# Patient Record
Sex: Male | Born: 1935 | Race: White | Hispanic: No | Smoking: Former smoker
Health system: Southern US, Community
[De-identification: ages and names within clinical notes are randomized; demographics above are authoritative.]

## PROBLEM LIST (undated history)

## (undated) DIAGNOSIS — R51 Headache: Secondary | ICD-10-CM

## (undated) DIAGNOSIS — M1990 Unspecified osteoarthritis, unspecified site: Secondary | ICD-10-CM

## (undated) DIAGNOSIS — J189 Pneumonia, unspecified organism: Secondary | ICD-10-CM

## (undated) DIAGNOSIS — K219 Gastro-esophageal reflux disease without esophagitis: Secondary | ICD-10-CM

## (undated) DIAGNOSIS — I739 Peripheral vascular disease, unspecified: Secondary | ICD-10-CM

## (undated) DIAGNOSIS — I2510 Atherosclerotic heart disease of native coronary artery without angina pectoris: Secondary | ICD-10-CM

## (undated) HISTORY — PX: CARDIAC CATHETERIZATION: SHX172

---
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
## 1996-10-30 HISTORY — PX: CORONARY ARTERY BYPASS GRAFT: SHX141

## 2000-10-02 ENCOUNTER — Encounter: Admission: RE | Admit: 2000-10-02 | Discharge: 2000-10-02 | Payer: Self-pay

## 2003-01-21 ENCOUNTER — Encounter: Admission: RE | Admit: 2003-01-21 | Discharge: 2003-01-21 | Payer: Self-pay

## 2010-06-28 ENCOUNTER — Inpatient Hospital Stay (HOSPITAL_COMMUNITY): Admission: RE | Admit: 2010-06-28 | Discharge: 2010-07-02 | Payer: Self-pay | Admitting: Orthopedic Surgery

## 2010-09-16 HISTORY — PX: JOINT REPLACEMENT: SHX530

## 2010-11-28 LAB — BASIC METABOLIC PANEL
GFR calc Af Amer: >60 mL/min (ref >60)
GFR calc non Af Amer: 55 mL/min — ABNORMAL LOW (ref >60)
Glucose, Bld: 118 mg/dL — ABNORMAL HIGH (ref 70–99)
Potassium: 4.8 mEq/L (ref 3.5–5.1)
Sodium: 133 mEq/L — ABNORMAL LOW (ref 135–145)

## 2010-11-28 LAB — CBC
HCT: 27.3 % — ABNORMAL LOW (ref 39.0–52.0)
HCT: 29.4 % — ABNORMAL LOW (ref 39.0–52.0)
Hemoglobin: 10.2 g/dL — ABNORMAL LOW (ref 13.0–17.0)
Hemoglobin: 9.4 g/dL — ABNORMAL LOW (ref 13.0–17.0)
MCHC: 34.5 g/dL (ref 30.0–36.0)
MCHC: 34.8 g/dL (ref 30.0–36.0)
RBC: 2.92 MIL/uL — ABNORMAL LOW (ref 4.22–5.81)
RBC: 3.16 MIL/uL — ABNORMAL LOW (ref 4.22–5.81)
WBC: 9.8 10*3/uL (ref 4.0–10.5)

## 2010-11-28 LAB — PROTIME-INR
INR: 2.04 — ABNORMAL HIGH (ref 0.00–1.49)
INR: 2.1 — ABNORMAL HIGH (ref 0.00–1.49)
INR: 2.17 — ABNORMAL HIGH (ref 0.00–1.49)
Prothrombin Time: 23.2 seconds — ABNORMAL HIGH (ref 11.6–15.2)

## 2010-11-29 LAB — PROTIME-INR
INR: 1.04 (ref 0.00–1.49)
Prothrombin Time: 13.8 seconds (ref 11.6–15.2)

## 2010-11-29 LAB — URINALYSIS, ROUTINE W REFLEX MICROSCOPIC
Bilirubin Urine: NEGATIVE
Glucose, UA: NEGATIVE mg/dL
Leukocytes, UA: NEGATIVE
Nitrite: NEGATIVE
Protein, ur: NEGATIVE mg/dL
Specific Gravity, Urine: 1.012 (ref 1.005–1.030)
Specific Gravity, Urine: 1.027 (ref 1.005–1.030)
Urobilinogen, UA: 0.2 mg/dL (ref 0.0–1.0)
Urobilinogen, UA: 0.2 mg/dL (ref 0.0–1.0)
pH: 5.5 (ref 5.0–8.0)

## 2010-11-29 LAB — COMPREHENSIVE METABOLIC PANEL
Albumin: 3.9 g/dL (ref 3.5–5.2)
Alkaline Phosphatase: 80 U/L (ref 39–117)
BUN: 14 mg/dL (ref 6–23)
Calcium: 9.5 mg/dL (ref 8.4–10.5)
Glucose, Bld: 80 mg/dL (ref 70–99)
Potassium: 4.3 mEq/L (ref 3.5–5.1)
Sodium: 140 mEq/L (ref 135–145)
Total Protein: 7 g/dL (ref 6.0–8.3)

## 2010-11-29 LAB — BASIC METABOLIC PANEL
CO2: 28 mEq/L (ref 19–32)
Chloride: 101 mEq/L (ref 96–112)
Creatinine, Ser: 1.18 mg/dL (ref 0.4–1.5)
GFR calc Af Amer: >60 mL/min (ref >60)
Glucose, Bld: 128 mg/dL — ABNORMAL HIGH (ref 70–99)

## 2010-11-29 LAB — CBC
MCH: 32 pg (ref 26.0–34.0)
MCHC: 34.5 g/dL (ref 30.0–36.0)
MCHC: 34.6 g/dL (ref 30.0–36.0)
MCV: 92.5 fL (ref 78.0–100.0)
MCV: 92.8 fL (ref 78.0–100.0)
Platelets: 183 10*3/uL (ref 150–400)
Platelets: 213 10*3/uL (ref 150–400)
RBC: 3.18 MIL/uL — ABNORMAL LOW (ref 4.22–5.81)
RDW: 12.8 % (ref 11.5–15.5)
WBC: 7 10*3/uL (ref 4.0–10.5)

## 2010-11-29 LAB — URINE MICROSCOPIC-ADD ON

## 2010-11-29 LAB — TYPE AND SCREEN
ABO/RH(D): O POS
Antibody Screen: NEGATIVE

## 2011-09-01 IMAGING — CR DG HIP 1V PORT*R*
1 series · 1 of 1 positions shown · non-contrast
Comparison: 06/21/2010.

CLINICAL DATA: 77-year-old male status post right total hip.

PORTABLE RIGHT HIP - 1 VIEW

[series 1]
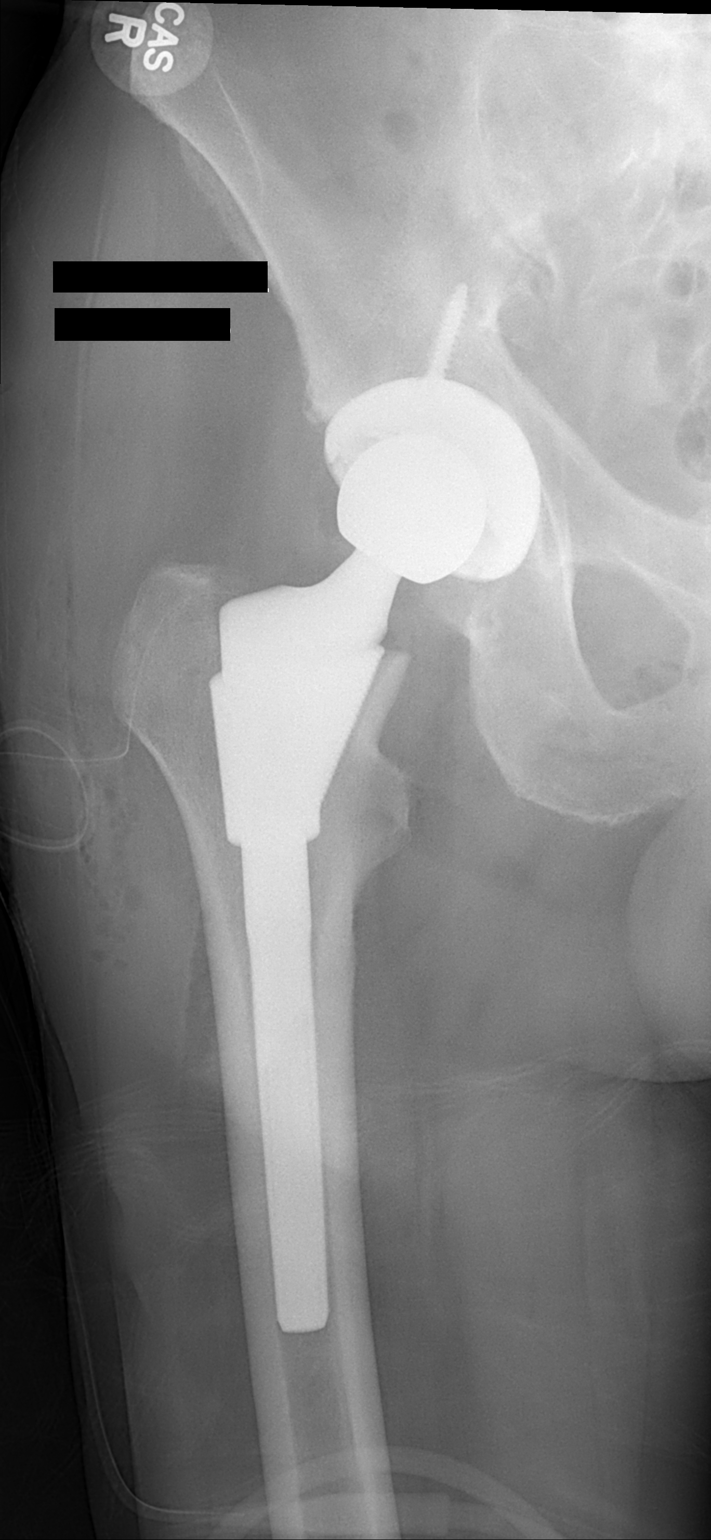

[1 of 1 positions shown; findings below may reference images not displayed]

FINDINGS: Portable AP view at 8240 hours.  Right total hip
arthroplasty components now in place and appear normally aligned on
the AP view.  Small postoperative drain in place.  No adverse
hardware or bony features.
IMPRESSION: Right total hip arthroplasty with normal alignment on the AP view
and no adverse features.

## 2011-09-01 IMAGING — CR DG PORTABLE PELVIS
1 series · 1 of 1 positions shown · non-contrast
Comparison: 06/21/2010.

CLINICAL DATA: 77-year-old male status post right total hip.

PORTABLE PELVIS

[series 1]
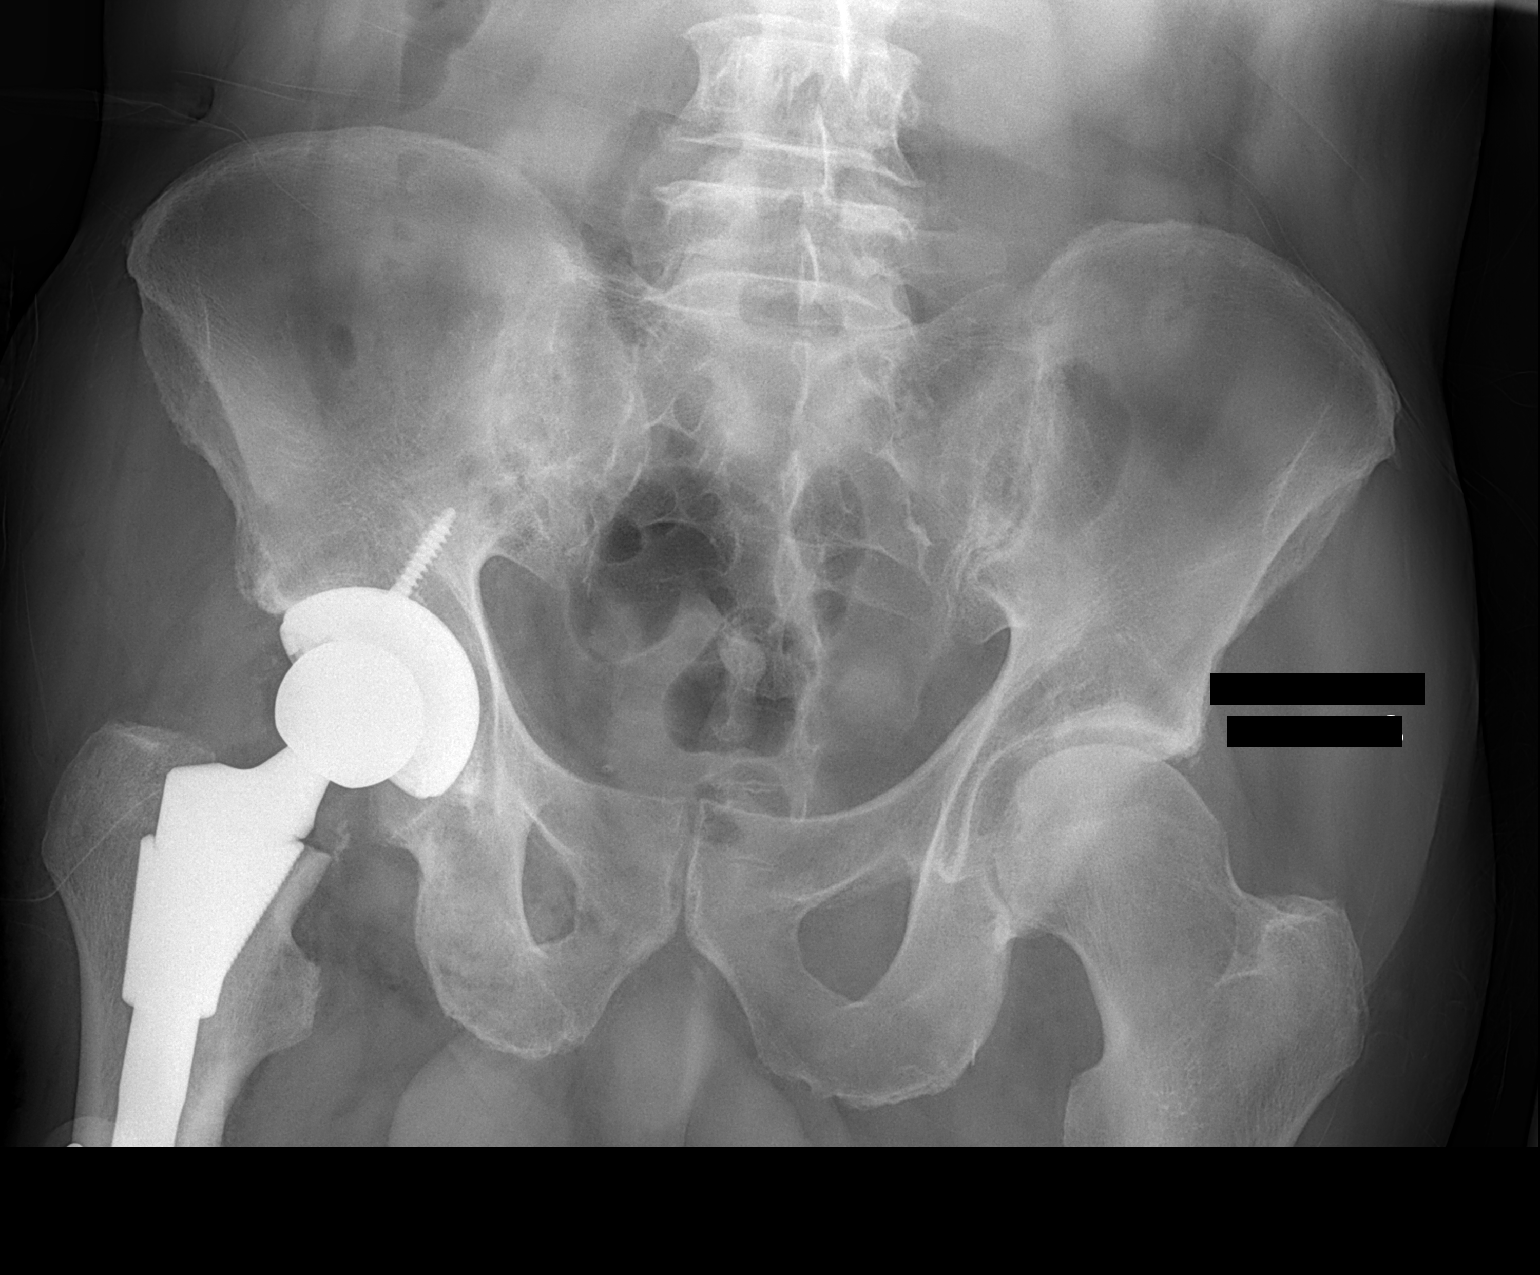

[1 of 1 positions shown; findings below may reference images not displayed]

FINDINGS: Portable AP view the pelvis at 0241 hours.  Right total
hip arthroplasty.  Femoral component not entirely included.  Normal
alignment.  Percutaneous small postoperative drain in place.
Stable surgical clips about the left inguinal region.  Stable
osseous structures otherwise.
IMPRESSION: Right total hip arthroplasty.  No adverse features.

## 2013-07-06 ENCOUNTER — Other Ambulatory Visit: Payer: Self-pay | Admitting: Orthopedic Surgery

## 2013-07-06 NOTE — H&P (Signed)
Mario Edwards  DOB: 10/10/1931 Married / Language: English / Race: White Male  Date of Admission:  07/19/2013  Chief Complaint:  Left Knee Pain  History of Present Illness The patient is a 80 year old male who comes in for a preoperative History and Physical. The patient is scheduled for a left total knee arthroplasty to be performed by Dr. Frank V. Aluisio, MD at [facility] on 07/19/2013. The patient is a 80 year old male who presents today for follow up of their knee. The patient is being followed for their bilateral knee pain and osteoarthritis (last evaluation 10/11/10). Symptoms reported today include: pain (that is getting progressively worse over time, left knee is worse) and instability (currently using a cane), while the patient does not report symptoms of: swelling. The following medication has been used for pain control: none. Mario Edwards comes in today for evaluation of both of his knees. He was last seen back in Jan. 2012 for follow up on his gel injection series. He states today that the shots really did not help his knees. He has been putting up with them for a while but they have progressively gotten worse with time. The knees have now become more of an issue and he has started to rely on his cane more and more. The knees are popping and grinding and now the give him the sensation of buckling on him. There is also a locking sensation with both. He feels more unstable due to the progressive nature of his knees. He complains of sedintary stiffness first thing in the morning and with any episode of sitting or riding in the car. He denies swelling with either knee. He originally had this visit scheduled to come in and discuss knee replacements and had questions about getting them both replaced at the same time. Unfortunately, he wife recently received a new diagnosis.  She has been evaluated and he is now ready to proceed with knee replacement at this time. He does  have a significant history of having coronary arterail disease that required a 4 vessel bypass back on Feb. 14, 1998. He denied having any myocardial infarction or caronary event prior to his surgery. They fortunately caught it and he denies having any cardiac symptoms since then. He sees a cardiologist over in [place], [place]. The knees are getting worse. His left knee hurts more than the right, but both are bothering him at all times. He is ready to get his knee replaced. Injections in the past did not help. He is ready to proceed to surgery at this time.   Problem List Primary osteoarthritis of both knees (715.16)    Allergies No Known Drug Allergies    Family History Cerebrovascular Accident. First Degree Relatives. father Cancer. mother    Social History Exercise. Exercises daily; does other Illicit drug use. no Drug/Alcohol Rehab (Previously). no Current work status. retired Drug/Alcohol Rehab (Currently). no Living situation. live with spouse Tobacco / smoke exposure. yes Pain Contract. no Marital status. married Number of flights of stairs before winded. 1 Tobacco use. Former smoker. former smoker; smoke(d) 3/4 pack(s) per day; uses 1 1/2 can(s) smokeless per week Alcohol use. current drinker; drinks beer, wine and hard liquor; less than 5 per week Children. 1 Advance Directives. Living Will Post-Surgical Plans. [facility]    Medication History Aspirin (325MG Tablet, 1 (one) Oral) Active. Ranitidine HCl ( Oral) Specific dose unknown - Active. Lipitor (40MG Tablet, Oral) Active. Toprol XL (   Oral) Specific dose unknown - Active. Calcium 600 ( Oral) Specific dose unknown - Active.    Past Surgical History Coronary Artery Bypass Graft. 4 Total Hip Replacement. right Appendectomy. Date: 1950. Vasectomy    Medical History Coronary artery disease Aneurysm. Femoral Hypercholesterolemia Carotid Artery  Stenosis. approxiamtely 20% bilateral (as per patient)    Review of Systems General:Not Present- Chills, Fever, Night Sweats, Fatigue, Weight Gain, Weight Loss and Memory Loss. Skin:Not Present- Hives, Itching, Rash, Eczema and Lesions. HEENT:Present- Double Vision. Not Present- Tinnitus, Headache, Visual Loss, Hearing Loss and Dentures. Respiratory:Present- Shortness of breath with exertion. Not Present- Shortness of breath at rest, Allergies, Coughing up blood and Chronic Cough. Cardiovascular:Not Present- Chest Pain, Racing/skipping heartbeats, Difficulty Breathing Lying Down, Murmur, Swelling and Palpitations. Gastrointestinal:Not Present- Bloody Stool, Heartburn, Abdominal Pain, Vomiting, Nausea, Constipation, Diarrhea, Difficulty Swallowing, Jaundice and Loss of appetitie. Male Genitourinary:Present- Weak urinary stream and Urinating at Night. Not Present- Urinary frequency, Blood in Urine, Discharge, Flank Pain, Incontinence, Painful Urination, Urgency and Urinary Retention. Musculoskeletal:Present- Morning Stiffness. Not Present- Muscle Weakness, Muscle Pain, Joint Swelling, Joint Pain, Back Pain and Spasms. Neurological:Not Present- Tremor, Dizziness, Blackout spells, Paralysis, Difficulty with balance and Weakness. Psychiatric:Not Present- Insomnia.    Vitals Weight: 188 lb Height: 72 in Weight was reported by patient. Height was reported by patient. Body Surface Area: 2.08 m Body Mass Index: 25.5 kg/m Pulse: 64 (Regular) Resp.: 14 (Unlabored) BP: 130/70 (Sitting, Right Arm, Standard)     Physical Exam The physical exam findings are as follows:   General Mental Status - Alert, cooperative and good historian. General Appearance- pleasant. Not in acute distress. Orientation- Oriented X3. Build & Nutrition- Well nourished and Well developed.   Head and Neck Head- normocephalic, atraumatic . Neck Global Assessment- supple. no  bruit auscultated on the right and no bruit auscultated on the left.   Eye Vision- Wears corrective lenses. Pupil- Bilateral- Regular and Round. Motion- Bilateral- EOMI.   Chest and Lung Exam Auscultation: Breath sounds:- clear at anterior chest wall and - clear at posterior chest wall. Adventitious sounds:- No Adventitious sounds.   Cardiovascular Auscultation:Rhythm- Regular rate and rhythm. Heart Sounds- S1 WNL and S2 WNL. Murmurs & Other Heart Sounds: Murmur 1:Location- Pulmonic Area (faint). Grade- II/VI. Character- Crescendo/Decrescendo and Low pitched.   Abdomen Palpation/Percussion:Tenderness- Abdomen is non-tender to palpation. Rigidity (guarding)- Abdomen is soft. Auscultation:Auscultation of the abdomen reveals - Bowel sounds normal.   Male Genitourinary Not done, not pertinent to present illness  Musculoskeletal On exam, he is a well-developed male, alert and oriented, in no apparent distress. Both hips show normal ROM and no discomfort. Both knees show no effusion. There is marked crepitus on ROM of both knees. He has varus deformities bilaterally. Range is about 5-125 on each knee. There is no instability noted. Gait pattern is antalgic.  RADIOGRAPHS: AP and lateral of both knees show end stage arthritic changes, bone on bone, medial and patellofemoral compartments of both knees.   Assessment & Plan Primary osteoarthritis of both knees (715.16) Impression: Left Knee  Note: Plan is for a left Total knee Replacement by Dr. Aluisio.  Plan is to go [facility] [place].  PCP - Dr. Cheek - Patient has been seen preoperatively and felt to be stable for surgery.  The patient will not receive TXA (tranexamic acid) due to: Heart Disease, Coronary Arterial Disease, Mild Carotid Arterial Disease  Signed electronically by Correna Meacham L Avant Printy, III PA-C 

## 2013-07-09 ENCOUNTER — Encounter (HOSPITAL_COMMUNITY): Payer: Self-pay | Admitting: Pharmacy Technician

## 2013-07-13 ENCOUNTER — Other Ambulatory Visit (HOSPITAL_COMMUNITY): Payer: Self-pay | Admitting: *Deleted

## 2013-07-13 NOTE — Patient Instructions (Addendum)
20      Your procedure is scheduled on:  Monday 07/19/2013  Report to [facility] at 1205 pM.  Call this number if you have problems the night before or morning of surgery:  [phone]   Remember:             IF YOU USE CPAP,BRING MASK AND TUBING AM OF SURGERY!   Do not eat food AFTER MIDNIGHT!MAY HAVE CLEAR LIQUIDS FROM MIDNIGHT UP UNTIL 0905 AM THE MORNING OF SURGERY AND THEN NOTHING UNTIL AFTER SURGERY!  Take these medicines the morning of surgery with A SIP OF WATER: Metoprolol, Zantac   Do not bring valuables to the hospital. [place] IS NOT RESPONSIBLE  FOR ANY BELONGINGS OR VALUABLES BROUGHT TO HOSPITAL.  Marland Kitchen  Leave suitcase in the car. After surgery it may be brought to your room.  For patients admitted to the hospital, checkout time is 11:00 AM the day of              Discharge.    DO NOT WEAR JEWELRY , MAKE-UP, LOTIONS,POWDERS,PERFUMES!             WOMEN -DO NOT SHAVE LEGS OR UNDERARMS 12 HRS. BEFORE SURGERY!               MEN MAY SHAVE AS USUAL!             CONTACTS,DENTURES OR BRIDGEWORK, FALSE EYELASHES MAY NOT BE WORN INTO SURGERY!                                           Patients discharged the day of surgery will not be allowed to drive home. If going home the same day of surgery, must have someone stay with you  first 24 hrs.at home and arrange for someone to drive you home from the Hospital.                           YOUR DRIVER IS:N/A   Special Instructions:             Please read over the following fact sheets that you were given:             1. [place] PREPARING FOR SURGERY SHEET              2.[facility]                                        [place].Keyerra Lamere,RN,BSN     [phone]                FAILURE TO FOLLOW THESE INSTRUCTIONS MAY RESULT IN  CANCELLATION OF YOUR SURGERY!               Patient Signature:___________________________

## 2013-07-14 ENCOUNTER — Encounter (HOSPITAL_COMMUNITY): Payer: Self-pay

## 2013-07-14 ENCOUNTER — Ambulatory Visit (HOSPITAL_COMMUNITY)
Admission: RE | Admit: 2013-07-14 | Discharge: 2013-07-14 | Disposition: A | Discharge status: Home / Self Care | Payer: Medicare Other | Source: Ambulatory Visit | Attending: Orthopedic Surgery | Admitting: Orthopedic Surgery

## 2013-07-14 ENCOUNTER — Encounter (HOSPITAL_COMMUNITY)
Admission: RE | Admit: 2013-07-14 | Discharge: 2013-07-14 | Disposition: A | Discharge status: Home / Self Care | Payer: Medicare Other | Source: Ambulatory Visit | Attending: Orthopedic Surgery | Admitting: Orthopedic Surgery

## 2013-07-14 DIAGNOSIS — M1710 Unilateral primary osteoarthritis, unspecified knee: Secondary | ICD-10-CM | POA: Insufficient documentation

## 2013-07-14 DIAGNOSIS — Z01818 Encounter for other preprocedural examination: Secondary | ICD-10-CM | POA: Insufficient documentation

## 2013-07-14 DIAGNOSIS — Z951 Presence of aortocoronary bypass graft: Secondary | ICD-10-CM | POA: Insufficient documentation

## 2013-07-14 HISTORY — DX: Peripheral vascular disease, unspecified: I73.9

## 2013-07-14 HISTORY — DX: Pneumonia, unspecified organism: J18.9

## 2013-07-14 HISTORY — DX: Unspecified osteoarthritis, unspecified site: M19.90

## 2013-07-14 HISTORY — DX: Atherosclerotic heart disease of native coronary artery without angina pectoris: I25.10

## 2013-07-14 HISTORY — DX: Gastro-esophageal reflux disease without esophagitis: K21.9

## 2013-07-14 HISTORY — DX: Headache: R51

## 2013-07-14 LAB — URINALYSIS, ROUTINE W REFLEX MICROSCOPIC
Glucose, UA: NEGATIVE mg/dL
Hgb urine dipstick: NEGATIVE
Ketones, ur: NEGATIVE mg/dL
Leukocytes, UA: NEGATIVE
Specific Gravity, Urine: 1.016 (ref 1.005–1.030)

## 2013-07-14 LAB — CBC
HCT: 40.6 % (ref 39.0–52.0)
Hemoglobin: 14 g/dL (ref 13.0–17.0)
MCH: 32.1 pg (ref 26.0–34.0)
MCHC: 34.5 g/dL (ref 30.0–36.0)
MCV: 93.1 fL (ref 78.0–100.0)
Platelets: 197 K/uL (ref 150–400)
RBC: 4.36 MIL/uL (ref 4.22–5.81)
RDW: 12.9 % (ref 11.5–15.5)
WBC: 5.6 K/uL (ref 4.0–10.5)

## 2013-07-14 LAB — COMPREHENSIVE METABOLIC PANEL WITH GFR
ALT: 23 U/L (ref 0–53)
AST: 27 U/L (ref 0–37)
Albumin: 4.3 g/dL (ref 3.5–5.2)
Alkaline Phosphatase: 76 U/L (ref 39–117)
BUN: 18 mg/dL (ref 6–23)
CO2: 28 meq/L (ref 19–32)
Calcium: 10.1 mg/dL (ref 8.4–10.5)
Chloride: 101 meq/L (ref 96–112)
Creatinine, Ser: 1.18 mg/dL (ref 0.50–1.35)
GFR calc Af Amer: 66 mL/min — ABNORMAL LOW
GFR calc non Af Amer: 57 mL/min — ABNORMAL LOW
Glucose, Bld: 98 mg/dL (ref 70–99)
Potassium: 5 meq/L (ref 3.5–5.1)
Sodium: 134 meq/L — ABNORMAL LOW (ref 135–145)
Total Bilirubin: 0.6 mg/dL (ref 0.3–1.2)
Total Protein: 7.3 g/dL (ref 6.0–8.3)

## 2013-07-14 LAB — PROTIME-INR
INR: 0.99 (ref 0.00–1.49)
Prothrombin Time: 12.9 s (ref 11.6–15.2)

## 2013-07-14 LAB — SURGICAL PCR SCREEN: MRSA, PCR: NEGATIVE

## 2013-07-14 NOTE — Progress Notes (Signed)
Office visit note from [facility] from 05/04/2013, EKG from 05/04/2013, Nuclear stress Imaging fro 12/14/2009 on chart. Surgical clearance from Dr. Beverely Pace from 05/04/2013 on chart.

## 2013-07-19 ENCOUNTER — Inpatient Hospital Stay (HOSPITAL_COMMUNITY): Payer: Medicare Other | Admitting: Anesthesiology

## 2013-07-19 ENCOUNTER — Encounter (HOSPITAL_COMMUNITY)
Admission: RE | Disposition: A | Discharge status: Skilled Nursing Facility | Payer: Self-pay | Source: Ambulatory Visit | Attending: Orthopedic Surgery

## 2013-07-19 ENCOUNTER — Encounter (HOSPITAL_COMMUNITY): Payer: Self-pay | Admitting: *Deleted

## 2013-07-19 ENCOUNTER — Encounter (HOSPITAL_COMMUNITY): Payer: Medicare Other | Admitting: Anesthesiology

## 2013-07-19 ENCOUNTER — Inpatient Hospital Stay (HOSPITAL_COMMUNITY)
Admission: RE | Admit: 2013-07-19 | Discharge: 2013-07-22 | DRG: 470 | Disposition: A | Discharge status: Skilled Nursing Facility | Payer: Medicare Other | Source: Ambulatory Visit | Attending: Orthopedic Surgery | Admitting: Orthopedic Surgery

## 2013-07-19 DIAGNOSIS — E871 Hypo-osmolality and hyponatremia: Secondary | ICD-10-CM | POA: Diagnosis not present

## 2013-07-19 DIAGNOSIS — D62 Acute posthemorrhagic anemia: Secondary | ICD-10-CM | POA: Diagnosis not present

## 2013-07-19 DIAGNOSIS — Z6825 Body mass index (BMI) 25.0-25.9, adult: Secondary | ICD-10-CM

## 2013-07-19 DIAGNOSIS — Z951 Presence of aortocoronary bypass graft: Secondary | ICD-10-CM

## 2013-07-19 DIAGNOSIS — I2510 Atherosclerotic heart disease of native coronary artery without angina pectoris: Secondary | ICD-10-CM | POA: Diagnosis present

## 2013-07-19 DIAGNOSIS — I739 Peripheral vascular disease, unspecified: Secondary | ICD-10-CM | POA: Diagnosis present

## 2013-07-19 DIAGNOSIS — Z96649 Presence of unspecified artificial hip joint: Secondary | ICD-10-CM

## 2013-07-19 DIAGNOSIS — Z96652 Presence of left artificial knee joint: Secondary | ICD-10-CM

## 2013-07-19 DIAGNOSIS — Z87891 Personal history of nicotine dependence: Secondary | ICD-10-CM

## 2013-07-19 DIAGNOSIS — M1710 Unilateral primary osteoarthritis, unspecified knee: Principal | ICD-10-CM | POA: Diagnosis present

## 2013-07-19 DIAGNOSIS — E7800 Pure hypercholesterolemia, unspecified: Secondary | ICD-10-CM | POA: Diagnosis present

## 2013-07-19 DIAGNOSIS — K219 Gastro-esophageal reflux disease without esophagitis: Secondary | ICD-10-CM | POA: Diagnosis present

## 2013-07-19 HISTORY — PX: TOTAL KNEE ARTHROPLASTY: SHX125

## 2013-07-19 SURGERY — ARTHROPLASTY, KNEE, TOTAL
Anesthesia: Spinal | Site: Knee | Laterality: Left | Wound class: Clean

## 2013-07-19 MED ORDER — DEXAMETHASONE SODIUM PHOSPHATE 10 MG/ML IJ SOLN
INTRAMUSCULAR | Status: DC | PRN
  Administered 2013-07-19: 10 mg via INTRAVENOUS

## 2013-07-19 MED ORDER — BISACODYL 10 MG RE SUPP: 10.0000 mg | Freq: Every day | RECTAL | Status: DC | PRN

## 2013-07-19 MED ORDER — MEPERIDINE HCL 50 MG/ML IJ SOLN
INTRAMUSCULAR | Status: AC
  Filled 2013-07-19: qty 1

## 2013-07-19 MED ORDER — LACTATED RINGERS IV SOLN
INTRAVENOUS | Status: DC
  Administered 2013-07-19: 17:00:00 via INTRAVENOUS
  Administered 2013-07-19: 1000 mL via INTRAVENOUS
  Administered 2013-07-19: 16:00:00 via INTRAVENOUS

## 2013-07-19 MED ORDER — CEFAZOLIN SODIUM-DEXTROSE 2-3 GM-% IV SOLR
2.0000 g | Freq: Four times a day (QID) | INTRAVENOUS | Status: AC
  Administered 2013-07-19 – 2013-07-20 (×2): 2 g via INTRAVENOUS
  Filled 2013-07-19 (×2): qty 50

## 2013-07-19 MED ORDER — MENTHOL 3 MG MT LOZG
1.0000 | LOZENGE | OROMUCOSAL | Status: DC | PRN
  Filled 2013-07-19: qty 9

## 2013-07-19 MED ORDER — METOCLOPRAMIDE HCL 10 MG PO TABS: 5.0000 mg | ORAL_TABLET | Freq: Three times a day (TID) | ORAL | Status: DC | PRN

## 2013-07-19 MED ORDER — BUPIVACAINE LIPOSOME 1.3 % IJ SUSP: 20.0000 mL | Freq: Once | INTRAMUSCULAR | Status: DC

## 2013-07-19 MED ORDER — MORPHINE SULFATE 2 MG/ML IJ SOLN: 1.0000 mg | INTRAMUSCULAR | Status: DC | PRN

## 2013-07-19 MED ORDER — METOCLOPRAMIDE HCL 5 MG/ML IJ SOLN: 5.0000 mg | Freq: Three times a day (TID) | INTRAMUSCULAR | Status: DC | PRN

## 2013-07-19 MED ORDER — FENTANYL CITRATE 0.05 MG/ML IJ SOLN
INTRAMUSCULAR | Status: DC | PRN
  Administered 2013-07-19: 50 ug via INTRAVENOUS

## 2013-07-19 MED ORDER — PROPOFOL 10 MG/ML IV BOLUS
INTRAVENOUS | Status: DC | PRN
  Administered 2013-07-19: 30 mg via INTRAVENOUS

## 2013-07-19 MED ORDER — CEFAZOLIN SODIUM-DEXTROSE 2-3 GM-% IV SOLR
2.0000 g | INTRAVENOUS | Status: AC
  Administered 2013-07-19: 2 g via INTRAVENOUS

## 2013-07-19 MED ORDER — BUPIVACAINE HCL 0.25 % IJ SOLN
INTRAMUSCULAR | Status: DC | PRN
  Administered 2013-07-19: 30 mL

## 2013-07-19 MED ORDER — ATORVASTATIN CALCIUM 40 MG PO TABS
40.0000 mg | ORAL_TABLET | Freq: Every morning | ORAL | Status: DC
  Administered 2013-07-20 – 2013-07-22 (×3): 40 mg via ORAL
  Filled 2013-07-19 (×3): qty 1

## 2013-07-19 MED ORDER — PROMETHAZINE HCL 25 MG/ML IJ SOLN: 6.2500 mg | INTRAMUSCULAR | Status: DC | PRN

## 2013-07-19 MED ORDER — MIDAZOLAM HCL 5 MG/5ML IJ SOLN
INTRAMUSCULAR | Status: DC | PRN
  Administered 2013-07-19: 2 mg via INTRAVENOUS

## 2013-07-19 MED ORDER — ACETAMINOPHEN 325 MG PO TABS: 650.0000 mg | ORAL_TABLET | Freq: Four times a day (QID) | ORAL | Status: DC | PRN

## 2013-07-19 MED ORDER — SODIUM CHLORIDE 0.9 % IJ SOLN
INTRAMUSCULAR | Status: AC
  Filled 2013-07-19: qty 20

## 2013-07-19 MED ORDER — KETOROLAC TROMETHAMINE 15 MG/ML IJ SOLN
7.5000 mg | Freq: Four times a day (QID) | INTRAMUSCULAR | Status: AC | PRN
  Administered 2013-07-19: 7.5 mg via INTRAVENOUS
  Filled 2013-07-19: qty 1

## 2013-07-19 MED ORDER — SODIUM CHLORIDE 0.9 % IJ SOLN
INTRAMUSCULAR | Status: DC | PRN
  Administered 2013-07-19: 30 mL via INTRAVENOUS

## 2013-07-19 MED ORDER — SODIUM CHLORIDE 0.9 % IV SOLN: INTRAVENOUS | Status: DC

## 2013-07-19 MED ORDER — ONDANSETRON HCL 4 MG/2ML IJ SOLN: 4.0000 mg | Freq: Four times a day (QID) | INTRAMUSCULAR | Status: DC | PRN

## 2013-07-19 MED ORDER — FAMOTIDINE 20 MG PO TABS
20.0000 mg | ORAL_TABLET | Freq: Every day | ORAL | Status: DC
  Administered 2013-07-20 – 2013-07-22 (×3): 20 mg via ORAL
  Filled 2013-07-19 (×3): qty 1

## 2013-07-19 MED ORDER — DIPHENHYDRAMINE HCL 12.5 MG/5ML PO ELIX
12.5000 mg | ORAL_SOLUTION | ORAL | Status: DC | PRN
  Administered 2013-07-19 – 2013-07-20 (×2): 25 mg via ORAL
  Filled 2013-07-19 (×2): qty 10

## 2013-07-19 MED ORDER — PHENOL 1.4 % MT LIQD
1.0000 | OROMUCOSAL | Status: DC | PRN
  Filled 2013-07-19: qty 177

## 2013-07-19 MED ORDER — DOCUSATE SODIUM 100 MG PO CAPS
100.0000 mg | ORAL_CAPSULE | Freq: Two times a day (BID) | ORAL | Status: DC
Start: 2013-07-19 — End: 2013-07-22
  Administered 2013-07-19 – 2013-07-22 (×6): 100 mg via ORAL

## 2013-07-19 MED ORDER — ONDANSETRON HCL 4 MG/2ML IJ SOLN
INTRAMUSCULAR | Status: DC | PRN
  Administered 2013-07-19: 4 mg via INTRAVENOUS

## 2013-07-19 MED ORDER — RIVAROXABAN 10 MG PO TABS
10.0000 mg | ORAL_TABLET | Freq: Every day | ORAL | Status: DC
  Administered 2013-07-20 – 2013-07-22 (×3): 10 mg via ORAL
  Filled 2013-07-19 (×4): qty 1

## 2013-07-19 MED ORDER — POLYETHYLENE GLYCOL 3350 17 G PO PACK: 17.0000 g | PACK | Freq: Every day | ORAL | Status: DC | PRN

## 2013-07-19 MED ORDER — DEXAMETHASONE 6 MG PO TABS
10.0000 mg | ORAL_TABLET | Freq: Every day | ORAL | Status: AC
  Administered 2013-07-20: 09:00:00 10 mg via ORAL
  Filled 2013-07-19: qty 1

## 2013-07-19 MED ORDER — BUPIVACAINE HCL (PF) 0.25 % IJ SOLN
INTRAMUSCULAR | Status: AC
  Filled 2013-07-19: qty 30

## 2013-07-19 MED ORDER — DEXAMETHASONE SODIUM PHOSPHATE 10 MG/ML IJ SOLN: 10.0000 mg | Freq: Once | INTRAMUSCULAR | Status: DC

## 2013-07-19 MED ORDER — BUPIVACAINE LIPOSOME 1.3 % IJ SUSP
INTRAMUSCULAR | Status: DC | PRN
  Administered 2013-07-19: 20 mL

## 2013-07-19 MED ORDER — METHOCARBAMOL 500 MG PO TABS
500.0000 mg | ORAL_TABLET | Freq: Four times a day (QID) | ORAL | Status: DC | PRN
  Administered 2013-07-20 – 2013-07-22 (×4): 500 mg via ORAL
  Filled 2013-07-19 (×5): qty 1

## 2013-07-19 MED ORDER — METHOCARBAMOL 100 MG/ML IJ SOLN
500.0000 mg | Freq: Four times a day (QID) | INTRAVENOUS | Status: DC | PRN
  Administered 2013-07-20: 500 mg via INTRAVENOUS
  Filled 2013-07-19: qty 5

## 2013-07-19 MED ORDER — FLEET ENEMA 7-19 GM/118ML RE ENEM: 1.0000 | ENEMA | Freq: Once | RECTAL | Status: AC | PRN

## 2013-07-19 MED ORDER — ACETAMINOPHEN 500 MG PO TABS
1000.0000 mg | ORAL_TABLET | Freq: Four times a day (QID) | ORAL | Status: AC
  Administered 2013-07-19 – 2013-07-20 (×4): 1000 mg via ORAL
  Filled 2013-07-19 (×4): qty 2

## 2013-07-19 MED ORDER — DEXAMETHASONE SODIUM PHOSPHATE 10 MG/ML IJ SOLN
10.0000 mg | Freq: Every day | INTRAMUSCULAR | Status: AC
  Filled 2013-07-19: qty 1

## 2013-07-19 MED ORDER — PHENYLEPHRINE HCL 10 MG/ML IJ SOLN
INTRAMUSCULAR | Status: DC | PRN
  Administered 2013-07-19: 40 ug via INTRAVENOUS

## 2013-07-19 MED ORDER — MEPERIDINE HCL 50 MG/ML IJ SOLN
6.2500 mg | INTRAMUSCULAR | Status: DC | PRN
  Administered 2013-07-19: 12.5 mg via INTRAVENOUS

## 2013-07-19 MED ORDER — CEFAZOLIN SODIUM-DEXTROSE 2-3 GM-% IV SOLR
INTRAVENOUS | Status: AC
  Filled 2013-07-19: qty 50

## 2013-07-19 MED ORDER — ACETAMINOPHEN 500 MG PO TABS
1000.0000 mg | ORAL_TABLET | Freq: Once | ORAL | Status: AC
  Administered 2013-07-19: 1000 mg via ORAL
  Filled 2013-07-19: qty 2

## 2013-07-19 MED ORDER — METOPROLOL SUCCINATE ER 50 MG PO TB24
50.0000 mg | ORAL_TABLET | Freq: Every morning | ORAL | Status: DC
  Administered 2013-07-20 – 2013-07-22 (×3): 50 mg via ORAL
  Filled 2013-07-19 (×3): qty 1

## 2013-07-19 MED ORDER — TRAMADOL HCL 50 MG PO TABS: 50.0000 mg | ORAL_TABLET | Freq: Four times a day (QID) | ORAL | Status: DC | PRN

## 2013-07-19 MED ORDER — OXYCODONE HCL 5 MG PO TABS
5.0000 mg | ORAL_TABLET | ORAL | Status: DC | PRN
  Administered 2013-07-19 – 2013-07-20 (×2): 5 mg via ORAL
  Administered 2013-07-20: 10 mg via ORAL
  Administered 2013-07-20 (×2): 5 mg via ORAL
  Administered 2013-07-21: 15:00:00 10 mg via ORAL
  Administered 2013-07-21: 5 mg via ORAL
  Administered 2013-07-21 – 2013-07-22 (×7): 10 mg via ORAL
  Filled 2013-07-19 (×2): qty 2
  Filled 2013-07-19 (×2): qty 1
  Filled 2013-07-19 (×2): qty 2
  Filled 2013-07-19: qty 1
  Filled 2013-07-19: qty 2
  Filled 2013-07-19 (×3): qty 1
  Filled 2013-07-19 (×4): qty 2

## 2013-07-19 MED ORDER — ACETAMINOPHEN 650 MG RE SUPP: 650.0000 mg | Freq: Four times a day (QID) | RECTAL | Status: DC | PRN

## 2013-07-19 MED ORDER — BUPIVACAINE IN DEXTROSE 0.75-8.25 % IT SOLN
INTRATHECAL | Status: DC | PRN
  Administered 2013-07-19: 2 mL via INTRATHECAL

## 2013-07-19 MED ORDER — PROPOFOL INFUSION 10 MG/ML OPTIME
INTRAVENOUS | Status: DC | PRN
  Administered 2013-07-19: 120 ug/kg/min via INTRAVENOUS

## 2013-07-19 MED ORDER — SODIUM CHLORIDE 0.9 % IV SOLN
INTRAVENOUS | Status: DC
  Administered 2013-07-19 (×2): via INTRAVENOUS

## 2013-07-19 MED ORDER — HYDROMORPHONE HCL PF 1 MG/ML IJ SOLN: 0.2500 mg | INTRAMUSCULAR | Status: DC | PRN

## 2013-07-19 MED ORDER — BUPIVACAINE LIPOSOME 1.3 % IJ SUSP
20.0000 mL | Freq: Once | INTRAMUSCULAR | Status: DC
  Filled 2013-07-19 (×2): qty 20

## 2013-07-19 MED ORDER — ONDANSETRON HCL 4 MG PO TABS: 4.0000 mg | ORAL_TABLET | Freq: Four times a day (QID) | ORAL | Status: DC | PRN

## 2013-07-19 SURGICAL SUPPLY — 58 items
BAG ZIPLOCK 12X15 (MISCELLANEOUS) IMPLANT
BANDAGE ELASTIC 6 VELCRO ST LF (GAUZE/BANDAGES/DRESSINGS) ×2 IMPLANT
BANDAGE ESMARK 6X9 LF (GAUZE/BANDAGES/DRESSINGS) ×1 IMPLANT
BLADE SAG 18X100X1.27 (BLADE) ×2 IMPLANT
BLADE SAW SGTL 11.0X1.19X90.0M (BLADE) ×2 IMPLANT
BNDG ESMARK 6X9 LF (GAUZE/BANDAGES/DRESSINGS) ×2
BOWL SMART MIX CTS (DISPOSABLE) ×2 IMPLANT
CAPT RP KNEE ×2 IMPLANT
CEMENT HV SMART SET (Cement) ×6 IMPLANT
CLOTH BEACON ORANGE TIMEOUT ST (SAFETY) IMPLANT
CUFF TOURN SGL QUICK 34 (TOURNIQUET CUFF) ×1
CUFF TRNQT CYL 34X4X40X1 (TOURNIQUET CUFF) ×1 IMPLANT
DECANTER SPIKE VIAL GLASS SM (MISCELLANEOUS) ×2 IMPLANT
DRAPE EXTREMITY T 121X128X90 (DRAPE) ×2 IMPLANT
DRAPE POUCH INSTRU U-SHP 10X18 (DRAPES) ×2 IMPLANT
DRAPE U-SHAPE 47X51 STRL (DRAPES) ×2 IMPLANT
DRSG ADAPTIC 3X8 NADH LF (GAUZE/BANDAGES/DRESSINGS) ×2 IMPLANT
DRSG EMULSION OIL 3X16 NADH (GAUZE/BANDAGES/DRESSINGS) ×2 IMPLANT
DRSG PAD ABDOMINAL 8X10 ST (GAUZE/BANDAGES/DRESSINGS) ×2 IMPLANT
DURAPREP 26ML APPLICATOR (WOUND CARE) ×2 IMPLANT
ELECT REM PT RETURN 9FT ADLT (ELECTROSURGICAL) ×2
ELECTRODE REM PT RTRN 9FT ADLT (ELECTROSURGICAL) ×1 IMPLANT
EVACUATOR 1/8 PVC DRAIN (DRAIN) ×2 IMPLANT
FACESHIELD LNG OPTICON STERILE (SAFETY) ×10 IMPLANT
GLOVE BIO SURGEON STRL SZ7.5 (GLOVE) ×6 IMPLANT
GLOVE BIO SURGEON STRL SZ8 (GLOVE) ×2 IMPLANT
GLOVE BIOGEL PI IND STRL 8 (GLOVE) ×2 IMPLANT
GLOVE BIOGEL PI INDICATOR 8 (GLOVE) ×2
GLOVE SURG SS PI 6.5 STRL IVOR (GLOVE) IMPLANT
GOWN PREVENTION PLUS LG XLONG (DISPOSABLE) ×2 IMPLANT
GOWN STRL REIN XL XLG (GOWN DISPOSABLE) ×6 IMPLANT
HANDPIECE INTERPULSE COAX TIP (DISPOSABLE) ×1
IMMOBILIZER KNEE 20 (SOFTGOODS) ×2
IMMOBILIZER KNEE 20 THIGH 36 (SOFTGOODS) ×1 IMPLANT
KIT BASIN OR (CUSTOM PROCEDURE TRAY) ×2 IMPLANT
MANIFOLD NEPTUNE II (INSTRUMENTS) ×2 IMPLANT
NDL SAFETY ECLIPSE 18X1.5 (NEEDLE) ×2 IMPLANT
NEEDLE HYPO 18GX1.5 SHARP (NEEDLE) ×2
NS IRRIG 1000ML POUR BTL (IV SOLUTION) ×2 IMPLANT
PACK TOTAL JOINT (CUSTOM PROCEDURE TRAY) ×2 IMPLANT
PAD ABD 7.5X8 STRL (GAUZE/BANDAGES/DRESSINGS) ×2 IMPLANT
PADDING CAST COTTON 6X4 STRL (CAST SUPPLIES) ×2 IMPLANT
POSITIONER SURGICAL ARM (MISCELLANEOUS) ×2 IMPLANT
SET HNDPC FAN SPRY TIP SCT (DISPOSABLE) ×1 IMPLANT
SPONGE GAUZE 4X4 12PLY (GAUZE/BANDAGES/DRESSINGS) ×2 IMPLANT
STRIP CLOSURE SKIN 1/2X4 (GAUZE/BANDAGES/DRESSINGS) ×2 IMPLANT
SUCTION FRAZIER 12FR DISP (SUCTIONS) ×2 IMPLANT
SUT MNCRL AB 4-0 PS2 18 (SUTURE) ×2 IMPLANT
SUT VIC AB 2-0 CT1 27 (SUTURE) ×3
SUT VIC AB 2-0 CT1 TAPERPNT 27 (SUTURE) ×3 IMPLANT
SUT VLOC 180 0 24IN GS25 (SUTURE) ×2 IMPLANT
SYR 20CC LL (SYRINGE) ×2 IMPLANT
SYR 50ML LL SCALE MARK (SYRINGE) ×2 IMPLANT
TOWEL OR 17X26 10 PK STRL BLUE (TOWEL DISPOSABLE) ×2 IMPLANT
TRAY FOLEY CATH 14FRSI W/METER (CATHETERS) IMPLANT
TRAY FOLEY CATH 16FRSI W/METER (SET/KITS/TRAYS/PACK) ×2 IMPLANT
WATER STERILE IRR 1500ML POUR (IV SOLUTION) ×4 IMPLANT
WRAP KNEE MAXI GEL POST OP (GAUZE/BANDAGES/DRESSINGS) ×2 IMPLANT

## 2013-07-19 NOTE — Transfer of Care (Signed)
Immediate Anesthesia Transfer of Care Note  Patient: Mario Edwards  Procedure(s) Performed: Procedure(s): LEFT TOTAL KNEE ARTHROPLASTY (Left)  Patient Location: PACU  Anesthesia Type:MAC and Spinal  Level of Consciousness: Patient easily awoken, sedated, comfortable, cooperative, following commands, responds to stimulation.   Airway & Oxygen Therapy: Patient spontaneously breathing, ventilating well, oxygen via simple oxygen mask.  Post-op Assessment: Report given to PACU RN, vital signs reviewed and stable.   Post vital signs: Reviewed and stable.  Complications: No apparent anesthesia complications

## 2013-07-19 NOTE — Anesthesia Preprocedure Evaluation (Addendum)
Anesthesia Evaluation  Patient identified by MRN, date of birth, ID band Patient awake    Reviewed: Allergy & Precautions, H&P , NPO status , Patient's Chart, lab work & pertinent test results  Airway Mallampati: II TM Distance: >3 FB Neck ROM: Full    Dental no notable dental hx.    Pulmonary neg pulmonary ROS,  breath sounds clear to auscultation  Pulmonary exam normal       Cardiovascular + CAD, + CABG and + Peripheral Vascular Disease Rhythm:Regular Rate:Normal     Neuro/Psych negative neurological ROS  negative psych ROS   GI/Hepatic Neg liver ROS, GERD-  Medicated,  Endo/Other  negative endocrine ROS  Renal/GU negative Renal ROS  negative genitourinary   Musculoskeletal negative musculoskeletal ROS (+)   Abdominal   Peds negative pediatric ROS (+)  Hematology negative hematology ROS (+)   Anesthesia Other Findings   Reproductive/Obstetrics negative OB ROS                          Anesthesia Physical Anesthesia Plan  ASA: III  Anesthesia Plan: Spinal   Post-op Pain Management:    Induction: Intravenous  Airway Management Planned: Simple Face Mask  Additional Equipment:   Intra-op Plan:   Post-operative Plan:   Informed Consent: I have reviewed the patients History and Physical, chart, labs and discussed the procedure including the risks, benefits and alternatives for the proposed anesthesia with the patient or authorized representative who has indicated his/her understanding and acceptance.     Plan Discussed with: CRNA and Surgeon  Anesthesia Plan Comments:        Anesthesia Quick Evaluation

## 2013-07-19 NOTE — Interval H&P Note (Signed)
History and Physical Interval Note:  07/19/2013 3:27 PM  Mario Edwards  has presented today for surgery, with the diagnosis of OA OF LEFT KNEE  The various methods of treatment have been discussed with the patient and family. After consideration of risks, benefits and other options for treatment, the patient has consented to  Procedure(s): LEFT TOTAL KNEE ARTHROPLASTY (Left) as a surgical intervention .  The patient's history has been reviewed, patient examined, no change in status, stable for surgery.  I have reviewed the patient's chart and labs.  Questions were answered to the patient's satisfaction.     Loanne Drilling

## 2013-07-19 NOTE — H&P (View-Only) (Signed)
Mario Edwards. Mario Edwards  DOB: 1931-11-25 Married / Language: English / Race: White Male  Date of Admission:  07/19/2013  Chief Complaint:  Left Knee Pain  History of Present Illness The patient is a 79 year old male who comes in for a preoperative History and Physical. The patient is scheduled for a left total knee arthroplasty to be performed by Dr. Gus Edwards. Aluisio, MD at [facility] on 07/19/2013. The patient is a 79 year old male who presents today for follow up of their knee. The patient is being followed for their bilateral knee pain and osteoarthritis (last evaluation 10/11/10). Symptoms reported today include: pain (that is getting progressively worse over time, left knee is worse) and instability (currently using a cane), while the patient does not report symptoms of: swelling. The following medication has been used for pain control: none. Mario Edwards comes in today for evaluation of both of his knees. He was last seen back in Jan. 2012 for follow up on his gel injection series. He states today that the shots really did not help his knees. He has been putting up with them for a while but they have progressively gotten worse with time. The knees have now become more of an issue and he has started to rely on his cane more and more. The knees are popping and grinding and now the give him the sensation of buckling on him. There is also a locking sensation with both. He feels more unstable due to the progressive nature of his knees. He complains of sedintary stiffness first thing in the morning and with any episode of sitting or riding in the car. He denies swelling with either knee. He originally had this visit scheduled to come in and discuss knee replacements and had questions about getting them both replaced at the same time. Unfortunately, he wife recently received a new diagnosis.  She has been evaluated and he is now ready to proceed with knee replacement at this time. He does  have a significant history of having coronary arterail disease that required a 4 vessel bypass back on Feb. 14, 1998. He denied having any myocardial infarction or caronary event prior to his surgery. They fortunately caught it and he denies having any cardiac symptoms since then. He sees a cardiologist over in [place], [place]. The knees are getting worse. His left knee hurts more than the right, but both are bothering him at all times. He is ready to get his knee replaced. Injections in the past did not help. He is ready to proceed to surgery at this time.   Problem List Primary osteoarthritis of both knees (715.16)    Allergies No Known Drug Allergies    Family History Cerebrovascular Accident. First Degree Relatives. father Cancer. mother    Social History Exercise. Exercises daily; does other Illicit drug use. no Drug/Alcohol Rehab (Previously). no Current work status. retired Financial planner (Currently). no Living situation. live with spouse Tobacco / smoke exposure. yes Pain Contract. no Marital status. married Number of flights of stairs before winded. 1 Tobacco use. Former smoker. former smoker; smoke(d) 3/4 pack(s) per day; uses 1 1/2 can(s) smokeless per week Alcohol use. current drinker; drinks beer, wine and hard liquor; less than 5 per week Children. 1 Advance Directives. Living Will Post-Surgical Plans. [facility]    Medication History Aspirin (325MG  Tablet, 1 (one) Oral) Active. Ranitidine HCl ( Oral) Specific dose unknown - Active. Lipitor (40MG  Tablet, Oral) Active. Toprol XL (  Oral) Specific dose unknown - Active. Calcium 600 ( Oral) Specific dose unknown - Active.    Past Surgical History Coronary Artery Bypass Graft. 4 Total Hip Replacement. right Appendectomy. Date: 40. Vasectomy    Medical History Coronary artery disease Aneurysm. Femoral Hypercholesterolemia Carotid Artery  Stenosis. approxiamtely 20% bilateral (as per patient)    Review of Systems General:Not Present- Chills, Fever, Night Sweats, Fatigue, Weight Gain, Weight Loss and Memory Loss. Skin:Not Present- Hives, Itching, Rash, Eczema and Lesions. HEENT:Present- Double Vision. Not Present- Tinnitus, Headache, Visual Loss, Hearing Loss and Dentures. Respiratory:Present- Shortness of breath with exertion. Not Present- Shortness of breath at rest, Allergies, Coughing up blood and Chronic Cough. Cardiovascular:Not Present- Chest Pain, Racing/skipping heartbeats, Difficulty Breathing Lying Down, Murmur, Swelling and Palpitations. Gastrointestinal:Not Present- Bloody Stool, Heartburn, Abdominal Pain, Vomiting, Nausea, Constipation, Diarrhea, Difficulty Swallowing, Jaundice and Loss of appetitie. Male Genitourinary:Present- Weak urinary stream and Urinating at Night. Not Present- Urinary frequency, Blood in Urine, Discharge, Flank Pain, Incontinence, Painful Urination, Urgency and Urinary Retention. Musculoskeletal:Present- Morning Stiffness. Not Present- Muscle Weakness, Muscle Pain, Joint Swelling, Joint Pain, Back Pain and Spasms. Neurological:Not Present- Tremor, Dizziness, Blackout spells, Paralysis, Difficulty with balance and Weakness. Psychiatric:Not Present- Insomnia.    Vitals Weight: 188 lb Height: 72 in Weight was reported by patient. Height was reported by patient. Body Surface Area: 2.08 m Body Mass Index: 25.5 kg/m Pulse: 64 (Regular) Resp.: 14 (Unlabored) BP: 130/70 (Sitting, Right Arm, Standard)     Physical Exam The physical exam findings are as follows:   General Mental Status - Alert, cooperative and good historian. General Appearance- pleasant. Not in acute distress. Orientation- Oriented X3. Build & Nutrition- Well nourished and Well developed.   Head and Neck Head- normocephalic, atraumatic . Neck Global Assessment- supple. no  bruit auscultated on the right and no bruit auscultated on the left.   Eye Vision- Wears corrective lenses. Pupil- Bilateral- Regular and Round. Motion- Bilateral- EOMI.   Chest and Lung Exam Auscultation: Breath sounds:- clear at anterior chest wall and - clear at posterior chest wall. Adventitious sounds:- No Adventitious sounds.   Cardiovascular Auscultation:Rhythm- Regular rate and rhythm. Heart Sounds- S1 WNL and S2 WNL. Murmurs & Other Heart Sounds: Murmur 1:Location- Pulmonic Area (faint). Grade- II/VI. Character- Crescendo/Decrescendo and Low pitched.   Abdomen Palpation/Percussion:Tenderness- Abdomen is non-tender to palpation. Rigidity (guarding)- Abdomen is soft. Auscultation:Auscultation of the abdomen reveals - Bowel sounds normal.   Male Genitourinary Not done, not pertinent to present illness  Musculoskeletal On exam, he is a well-developed male, alert and oriented, in no apparent distress. Both hips show normal ROM and no discomfort. Both knees show no effusion. There is marked crepitus on ROM of both knees. He has varus deformities bilaterally. Range is about 5-125 on each knee. There is no instability noted. Gait pattern is antalgic.  RADIOGRAPHS: AP and lateral of both knees show end stage arthritic changes, bone on bone, medial and patellofemoral compartments of both knees.   Assessment & Plan Primary osteoarthritis of both knees (715.16) Impression: Left Knee  Note: Plan is for a left Total knee Replacement by Dr. Lequita Halt.  Plan is to go [facility] [employer].  PCP - Dr. Beverely Pace - Patient has been seen preoperatively and felt to be stable for surgery.  The patient will not receive TXA (tranexamic acid) due to: Heart Disease, Coronary Arterial Disease, Mild Carotid Arterial Disease  Signed electronically by Lauraine Rinne, III PA-C

## 2013-07-19 NOTE — Op Note (Signed)
Pre-operative diagnosis- Osteoarthritis  Left knee(s)  Post-operative diagnosis- Osteoarthritis Left knee(s)  Procedure-  Left  Total Knee Arthroplasty  Surgeon- Gus Rankin. Kathaleen Dudziak, MD  Assistant- Sarita Bottom, PA-C   Anesthesia-  Spinal EBL-* No blood loss amount entered *  Drains Hemovac  Tourniquet time-  Total Tourniquet Time Documented: Thigh (Left) - 38 minutes Total: Thigh (Left) - 38 minutes    Complications- None  Condition-PACU - hemodynamically stable.   Brief Clinical Note   Mario Edwards is a 79 y.o. year old male with end stage OA of his left knee with progressively worsening pain and dysfunction. He has constant pain, with activity and at rest and significant functional deficits with difficulties even with ADLs. He has had extensive non-op management including analgesics, injections of cortisone and viscosupplements, and home exercise program, but remains in significant pain with significant dysfunction. Radiographs show bone on bone arthritis medial and patellofemoral. He presents now for left Total Knee Arthroplasty.     Procedure in detail---   The patient is brought into the operating room and positioned supine on the operating table. After successful administration of  Regional,   a tourniquet is placed high on the  Left thigh(s) and the lower extremity is prepped and draped in the usual sterile fashion. Time out is performed by the operating team and then the  Left lower extremity is wrapped in Esmarch, knee flexed and the tourniquet inflated to 300 mmHg.       A midline incision is made with a ten blade through the subcutaneous tissue to the level of the extensor mechanism. A fresh blade is used to make a medial parapatellar arthrotomy. Soft tissue over the proximal medial tibia is subperiosteally elevated to the joint line with a knife and into the semimembranosus bursa with a Cobb elevator. Soft tissue over the proximal lateral tibia is elevated with attention being  paid to avoiding the patellar tendon on the tibial tubercle. The patella is everted, knee flexed 90 degrees and the ACL and PCL are removed. Findings are bone on bone medial and patellofemoral with large medial and patellar osteophytes.        The drill is used to create a starting hole in the distal femur and the canal is thoroughly irrigated with sterile saline to remove the fatty contents. The 5 degree Left  valgus alignment guide is placed into the femoral canal and the distal femoral cutting block is pinned to remove 10 mm off the distal femur. Resection is made with an oscillating saw.      The tibia is subluxed forward and the menisci are removed. The extramedullary alignment guide is placed referencing proximally at the medial aspect of the tibial tubercle and distally along the second metatarsal axis and tibial crest. The block is pinned to remove 2mm off the more deficient medial  side. Resection is made with an oscillating saw. Size 5is the most appropriate size for the tibia and the proximal tibia is prepared with the modular drill and keel punch for that size.      The femoral sizing guide is placed and size 5 is most appropriate. Rotation is marked off the epicondylar axis and confirmed by creating a rectangular flexion gap at 90 degrees. The size 5 cutting block is pinned in this rotation and the anterior, posterior and chamfer cuts are made with the oscillating saw. The intercondylar block is then placed and that cut is made.      Trial size 5 tibial  component, trial size 5 posterior stabilized femur and a 12.5  mm posterior stabilized rotating platform insert trial is placed. Full extension is achieved with excellent varus/valgus and anterior/posterior balance throughout full range of motion. The patella is everted and thickness measured to be 27  mm. Free hand resection is taken to 15 mm, a 41 template is placed, lug holes are drilled, trial patella is placed, and it tracks normally.  Osteophytes are removed off the posterior femur with the trial in place. All trials are removed and the cut bone surfaces prepared with pulsatile lavage. Cement is mixed and once ready for implantation, the size 5 tibial implant, size  5 posterior stabilized femoral component, and the size 41 patella are cemented in place and the patella is held with the clamp. The trial insert is placed and the knee held in full extension. The Exparel (20 ml mixed with 30 ml saline) and .25% Bupivicaine, are injected into the extensor mechanism, posterior capsule, medial and lateral gutters and subcutaneous tissues.  All extruded cement is removed and once the cement is hard the permanent 12.5 mm posterior stabilized rotating platform insert is placed into the tibial tray.      The wound is copiously irrigated with saline solution and the extensor mechanism closed over a hemovac drain with #1 PDS suture. The tourniquet is released for a total tourniquet time of 38  minutes. Flexion against gravity is 140 degrees and the patella tracks normally. Subcutaneous tissue is closed with 2.0 vicryl and subcuticular with running 4.0 Monocryl. The incision is cleaned and dried and steri-strips and a bulky sterile dressing are applied. The limb is placed into a knee immobilizer and the patient is awakened and transported to recovery in stable condition.      Please note that a surgical assistant was a medical necessity for this procedure in order to perform it in a safe and expeditious manner. Surgical assistant was necessary to retract the ligaments and vital neurovascular structures to prevent injury to them and also necessary for proper positioning of the limb to allow for anatomic placement of the prosthesis.   Gus Rankin Mario Helming, MD    07/19/2013, 4:57 PM

## 2013-07-19 NOTE — Anesthesia Procedure Notes (Signed)

## 2013-07-20 ENCOUNTER — Encounter (HOSPITAL_COMMUNITY): Payer: Self-pay | Admitting: Orthopedic Surgery

## 2013-07-20 DIAGNOSIS — D62 Acute posthemorrhagic anemia: Secondary | ICD-10-CM | POA: Diagnosis not present

## 2013-07-20 LAB — BASIC METABOLIC PANEL
CO2: 26 mEq/L (ref 19–32)
Calcium: 8.7 mg/dL (ref 8.4–10.5)
Chloride: 103 mEq/L (ref 96–112)
Creatinine, Ser: 1.03 mg/dL (ref 0.50–1.35)
GFR calc non Af Amer: 67 mL/min — ABNORMAL LOW (ref >90)
Glucose, Bld: 147 mg/dL — ABNORMAL HIGH (ref 70–99)
Potassium: 4.7 mEq/L (ref 3.5–5.1)
Sodium: 136 mEq/L (ref 135–145)

## 2013-07-20 LAB — CBC
Hemoglobin: 11.3 g/dL — ABNORMAL LOW (ref 13.0–17.0)
MCH: 32.1 pg (ref 26.0–34.0)
MCHC: 34.7 g/dL (ref 30.0–36.0)
MCV: 92.6 fL (ref 78.0–100.0)
RBC: 3.52 MIL/uL — ABNORMAL LOW (ref 4.22–5.81)
RDW: 12.9 % (ref 11.5–15.5)
WBC: 7.4 10*3/uL (ref 4.0–10.5)

## 2013-07-20 NOTE — Progress Notes (Signed)
   Subjective: 1 Day Post-Op Procedure(s) (LRB): LEFT TOTAL KNEE ARTHROPLASTY (Left) Patient reports pain as mild.   Patient seen in rounds with Dr. Lequita Halt. Patient is well, and has had no acute complaints or problems.  Denies CP, SOB, N/V.  Doing well this morning. We will start therapy today.  Plan is to go Skilled nursing facility after hospital stay.  Objective: Vital signs in last 24 hours: Temp:  [97.3 F (36.3 C)-98.7 F (37.1 C)] 98.7 F (37.1 C) (11/04 0200) Pulse Rate:  [47-68] 56 (11/04 0505) Resp:  [12-20] 20 (11/04 0200) BP: (102-165)/(43-76) 131/76 mmHg (11/04 0505) SpO2:  [98 %-100 %] 99 % (11/04 0505) Weight:  [84.369 kg (186 lb)] 84.369 kg (186 lb) (11/03 1823)  Intake/Output from previous day:  Intake/Output Summary (Last 24 hours) at 07/20/13 0710 Last data filed at 07/20/13 0508  Gross per 24 hour  Intake   3975 ml  Output   3175 ml  Net    800 ml    Intake/Output this shift: UOP 1400 since MN +800  Labs:  Recent Labs  07/20/13 0500  HGB 11.3*    Recent Labs  07/20/13 0500  WBC 7.4  RBC 3.52*  HCT 32.6*  PLT 157    Recent Labs  07/20/13 0500  NA 136  K 4.7  CL 103  CO2 26  BUN 15  CREATININE 1.03  GLUCOSE 147*  CALCIUM 8.7   No results found for this basename: LABPT, INR,  in the last 72 hours  EXAM General - Patient is Alert, Appropriate and Oriented Extremity - Neurovascular intact Sensation intact distally Dorsiflexion/Plantar flexion intact Dressing - dressing C/D/I Motor Function - intact, moving foot and toes well on exam.  Hemovac pulled without difficulty.  Past Medical History  Diagnosis Date  . Coronary artery disease   . Pneumonia     years ago  . Peripheral vascular disease   . Abdominal aortic aneurysm   . GERD (gastroesophageal reflux disease)   . Headache(784.0)     migraines  . Arthritis     Assessment/Plan: 1 Day Post-Op Procedure(s) (LRB): LEFT TOTAL KNEE ARTHROPLASTY (Left) Principal  Problem:   OA (osteoarthritis) of knee Active Problems:   Postoperative anemia due to acute blood loss  Estimated body mass index is 25.22 kg/(m^2) as calculated from the following:   Height as of this encounter: 6' (1.829 m).   Weight as of this encounter: 84.369 kg (186 lb). Up with therapy Discharge to SNF -[facility] [place]  DVT Prophylaxis - Xarelto, ASA 325 mg on hold, resume after Xarelto finished Weight-Bearing as tolerated to left leg D/C O2 and Pulse OX and try on Room Air  Mario Edwards 07/20/2013, 7:10 AM

## 2013-07-20 NOTE — Progress Notes (Signed)
Clinical Social Work Department BRIEF PSYCHOSOCIAL ASSESSMENT 07/20/2013  Patient:  Mario Edwards, Mario Edwards     Account Number:  [id number]     Admit date:  07/19/2013  Clinical Social Worker:  Candie Chroman  Date/Time:  07/20/2013 11:42 AM  Referred by:  Physician  Date Referred:  07/20/2013 Referred for  SNF Placement   Other Referral:   Interview type:  Patient Other interview type:    PSYCHOSOCIAL DATA Living Status:  WIFE Admitted from facility:   Level of care:   Primary support name:  Kasheem Toner Primary support relationship to patient:  SPOUSE Degree of support available:   limited due to medical issues    CURRENT CONCERNS Current Concerns  Post-Acute Placement   Other Concerns:    SOCIAL WORK ASSESSMENT / PLAN Pt is a 78 yr old gentleman living at home prior to hospitalization. CSW met with pt to assist with d/c planning. Pt ha smade prior arrangements to have ST Rehab at [facility] ) following hospital d/c. SNF has been contacted and d/c plan confirmed. CSW will follow to assist with d/c planning needs.   Assessment/plan status:  Psychosocial Support/Ongoing Assessment of Needs Other assessment/ plan:   Information/referral to community resources:   Insurance for SNF coverage / ambulance transport reviewed.    PATIENT'S/FAMILY'S RESPONSE TO PLAN OF CARE: Pt is looking forward to having rehab at [facility].   Cori Razor LCSW [phone]

## 2013-07-20 NOTE — Progress Notes (Signed)
Physical Therapy Treatment Patient Details Name: Mario Edwards MRN: [id number] DOB: [birth year] Today's Date: 07/20/2013 Time: 4098-1191 PT Time Calculation (min): 20 min  PT Assessment / Plan / Recommendation  History of Present Illness LTKA   PT Comments   Able to increase knee extension after exercises. Pt encouraged to perform knee presses.  Follow Up Recommendations  SNF     Does the patient have the potential to tolerate intense rehabilitation     Barriers to Discharge        Equipment Recommendations       Recommendations for Other Services    Frequency 7X/week   Progress towards PT Goals Progress towards PT goals: Progressing toward goals  Plan Current plan remains appropriate    Precautions / Restrictions Precautions Precautions: Fall;Knee   Pertinent Vitals/Pain 7 , RN notified.    Mobility       Exercises Total Joint Exercises Ankle Circles/Pumps: AROM;Both;10 reps;Supine Quad Sets: AROM;Left;10 reps;Supine Heel Slides: AAROM;Left;10 reps;Supine Hip ABduction/ADduction: AAROM;Left;10 reps;Supine Straight Leg Raises: AAROM;Left;10 reps;Supine   PT Diagnosis:    PT Problem List:   PT Treatment Interventions:     PT Goals (current goals can now be found in the care plan section)    Visit Information  Last PT Received On: 07/20/13 Assistance Needed: +1 History of Present Illness: LTKA    Subjective Data      Cognition  Cognition Arousal/Alertness: Awake/alert Behavior During Therapy: Impulsive    Balance     End of Session PT - End of Session Patient left: in bed;with call bell/phone within reach;with family/visitor present Nurse Communication: Mobility status;Patient requests pain meds   GP     Rada Hay 07/20/2013, 4:59 PM

## 2013-07-20 NOTE — Progress Notes (Signed)
Clinical Social Work Department CLINICAL SOCIAL WORK PLACEMENT NOTE 07/20/2013  Patient:  Mario Edwards, Mario Edwards  Account Number:  [id number] Admit date:  07/19/2013  Clinical Social Worker:  Cori Razor, LCSW  Date/time:  07/20/2013 11:49 AM  Clinical Social Work is seeking post-discharge placement for this patient at the following level of care:   SKILLED NURSING   (*CSW will update this form in Epic as items are completed)     Patient/family provided with [facility] Department of Clinical Social Work's list of facilities offering this level of care within the geographic area requested by the patient (or if unable, by the patient's family).  07/20/2013  Patient/family informed of their freedom to choose among providers that offer the needed level of care, that participate in Medicare, Medicaid or managed care program needed by the patient, have an available bed and are willing to accept the patient.    Patient/family informed of [facility]' ownership interest in [facility], as well as of the fact that they are under no obligation to receive care at this facility.  PASARR submitted to EDS on 07/20/2013 PASARR number received from EDS on   FL2 transmitted to all facilities in geographic area requested by pt/family on  07/20/2013 FL2 transmitted to all facilities within larger geographic area on   Patient informed that his/her managed care company has contracts with or will negotiate with  certain facilities, including the following:     Patient/family informed of bed offers received:  07/20/2013 Patient chooses bed at [facility], [place] Physician recommends and patient chooses bed at    [facility] [place] on   Patient to be transferred to facility by   The following physician request were entered in Epic:   Additional Comments:  Cori Razor LCSW [phone]

## 2013-07-20 NOTE — Progress Notes (Signed)
OT Cancellation Note  Patient Details Name: CHRISTINE SCHIEFELBEIN MRN: [id number] DOB: 20-Nov-1931   Cancelled Treatment:    Reason Eval/Treat Not Completed: Other (comment) Pt plans [facility] for rehab.  Will defer OT eval to that venue.  Hamdi Kley 07/20/2013, 10:25 AM Marica Otter, OTR/L [phone] 07/20/2013

## 2013-07-20 NOTE — Care Management Note (Signed)
    Page 1 of 1   07/20/2013     2:07:51 PM   CARE MANAGEMENT NOTE 07/20/2013  Patient:  Mario Edwards, Mario Edwards   Account Number:  [id number]  Date Initiated:  07/20/2013  Documentation initiated by:  Colleen Can  Subjective/Objective Assessment:   DX OA left knee; Total knee replacemnt -left     Action/Plan:   SNF rehab   Anticipated DC Date:  07/22/2013   Anticipated DC Plan:  SKILLED NURSING FACILITY  In-house referral  Clinical Social Worker      DC Planning Services  CM consult      Choice offered to / List presented to:             Status of service:  Completed, signed off Medicare Important Message given?  NA - LOS <3 / Initial given by admissions (If response is "NO", the following Medicare IM given date fields will be blank) Date Medicare IM given:   Date Additional Medicare IM given:    Discharge Disposition:    Per UR Regulation:    If discussed at Long Length of Stay Meetings, dates discussed:    Comments:

## 2013-07-20 NOTE — Progress Notes (Signed)
Utilization review completed.  

## 2013-07-20 NOTE — Evaluation (Signed)
Physical Therapy Evaluation Patient Details Name: Mario Edwards MRN: [id number] DOB: 09-10-1932 Today's Date: 07/20/2013 Time: 4098-1191 PT Time Calculation (min): 21 min  PT Assessment / Plan / Recommendation History of Present Illness  LTKA  Clinical Impression  Pt ambulated x 120'. Pt plans DC to SNF. Pt will benefit from PT to address problems listed.    PT Assessment  Patient needs continued PT services    Follow Up Recommendations  SNF    Does the patient have the potential to tolerate intense rehabilitation      Barriers to Discharge        Equipment Recommendations  None recommended by PT    Recommendations for Other Services     Frequency 7X/week    Precautions / Restrictions Precautions Precautions: Fall;Knee Required Braces or Orthoses: Knee Immobilizer - Left Restrictions Weight Bearing Restrictions: No   Pertinent Vitals/Pain Reports that L foot tingled when he put weight on it, able to feel LT. Resided after sitting down.pain <3 L knee.      Mobility  Bed Mobility Bed Mobility: Supine to Sit;Sitting - Scoot to Edge of Bed;Sit to Supine Supine to Sit: 4: Min assist Sitting - Scoot to Edge of Bed: 4: Min assist Details for Bed Mobility Assistance: suppport of L leg to lower to the floor. Transfers Transfers: Sit to Stand;Stand to Sit Sit to Stand: 4: Min assist;From bed;With upper extremity assist Stand to Sit: 4: Min assist;To chair/3-in-1;With upper extremity assist;With armrests Details for Transfer Assistance: cues for hand placement and L leg position prior to sitting down. Ambulation/Gait Ambulation/Gait Assistance: 4: Min assist Ambulation Distance (Feet): 120 Feet Assistive device: Rolling walker Ambulation/Gait Assistance Details: cues for sequence and position inside of RW, safety using RW Gait Pattern: Step-to pattern;Antalgic;Decreased stance time - left    Exercises     PT Diagnosis: Difficulty walking;Acute pain  PT Problem List:  Decreased strength;Decreased range of motion;Decreased activity tolerance;Decreased balance;Decreased mobility;Decreased knowledge of precautions;Decreased safety awareness;Decreased knowledge of use of DME;Pain PT Treatment Interventions: DME instruction;Gait training;Functional mobility training;Therapeutic activities;Therapeutic exercise;Patient/family education     PT Goals(Current goals can be found in the care plan section) Acute Rehab PT Goals Patient Stated Goal: I want to get up, get going. PT Goal Formulation: With patient Time For Goal Achievement: 07/27/13 Potential to Achieve Goals: Good  Visit Information  Last PT Received On: 07/20/13 Assistance Needed: +1 History of Present Illness: LTKA       Prior Functioning  Home Living Family/patient expects to be discharged to:: Skilled nursing facility Prior Function Level of Independence: Independent Communication Communication: No difficulties    Cognition  Cognition Arousal/Alertness: Awake/alert Behavior During Therapy: WFL for tasks assessed/performed Overall Cognitive Status: Within Functional Limits for tasks assessed    Extremity/Trunk Assessment Upper Extremity Assessment Upper Extremity Assessment: Overall WFL for tasks assessed Lower Extremity Assessment Lower Extremity Assessment: LLE deficits/detail LLE Deficits / Details: reports L foot feels tingling when he stood up, resided after sitting down. Able to perform SLR with lag. Knee extension lacks 20degrees. Cervical / Trunk Assessment Cervical / Trunk Assessment: Normal   Balance    End of Session PT - End of Session Equipment Utilized During Treatment: Left knee immobilizer Activity Tolerance: Patient tolerated treatment well Patient left: in chair;with call bell/phone within reach Nurse Communication: Mobility status CPM Left Knee CPM Left Knee: Off  GP     Rada Hay 07/20/2013, 11:34 AM Blanchard Kelch PT [phone]

## 2013-07-20 NOTE — Progress Notes (Signed)
Clinical Social Work Department BRIEF PSYCHOSOCIAL ASSESSMENT 07/20/2013  Patient:  Mario Edwards,Mario Edwards     Account Number:  [id number]     Admit date:  07/19/2013  Clinical Social Worker:  Reade Trefz, LCSW  Date/Time:  07/20/2013 11:42 AM  Referred by:  Physician  Date Referred:  07/20/2013 Referred for  SNF Placement   Other Referral:   Interview type:  Patient Other interview type:    PSYCHOSOCIAL DATA Living Status:  WIFE Admitted from facility:   Level of care:   Primary support name:  Rachel Eschbach Primary support relationship to patient:  SPOUSE Degree of support available:   limited due to medical issues    CURRENT CONCERNS Current Concerns  Post-Acute Placement   Other Concerns:    SOCIAL WORK ASSESSMENT / PLAN Pt is a 79 yr old gentleman living at home prior to hospitalization. CSW met with pt to assist with Edwards/c planning. Pt ha smade prior arrangements to have ST Rehab at [facility] ( [place] ) following hospital Edwards/c. SNF has been contacted and Edwards/c plan confirmed. CSW will follow to assist with Edwards/c planning needs.   Assessment/plan status:  Psychosocial Support/Ongoing Assessment of Needs Other assessment/ plan:   Information/referral to community resources:   Insurance for SNF coverage / ambulance transport reviewed.    PATIENT'S/FAMILY'S RESPONSE TO PLAN OF CARE: Pt is looking forward to having rehab at [facility].   Edrick Whitehorn LCSW [phone]     

## 2013-07-21 LAB — BASIC METABOLIC PANEL
CO2: 24 mEq/L (ref 19–32)
Chloride: 101 mEq/L (ref 96–112)
Creatinine, Ser: 1.21 mg/dL (ref 0.50–1.35)
GFR calc Af Amer: 64 mL/min — ABNORMAL LOW (ref >90)
GFR calc non Af Amer: 56 mL/min — ABNORMAL LOW (ref >90)
Potassium: 4.4 mEq/L (ref 3.5–5.1)

## 2013-07-21 LAB — CBC
Platelets: 169 10*3/uL (ref 150–400)
RBC: 3.14 MIL/uL — ABNORMAL LOW (ref 4.22–5.81)
RDW: 13.6 % (ref 11.5–15.5)
WBC: 9.9 10*3/uL (ref 4.0–10.5)

## 2013-07-21 NOTE — Progress Notes (Signed)
Physical Therapy Treatment Patient Details Name: Mario Edwards MRN: [id number] DOB: 07/31/1932 Today's Date: 07/21/2013 Time:  -     PT Assessment / Plan / Recommendation  History of Present Illness LTKA   PT Comments   POD # 2 am session.  Applied KI and instructed pt on use for amb and when to D/C.  Assisted pt OOB to amb in hallway then to recliner to perform TE's.  Applied ICE.     Follow Up Recommendations  SNF ([name])     Does the patient have the potential to tolerate intense rehabilitation     Barriers to Discharge        Equipment Recommendations  None recommended by PT    Recommendations for Other Services    Frequency 7X/week   Progress towards PT Goals Progress towards PT goals: Progressing toward goals  Plan      Precautions / Restrictions Precautions Precautions: Fall;Knee Precaution Comments: Instructed pt on KI use for amb and when to D/C Required Braces or Orthoses: Knee Immobilizer - Left Restrictions Weight Bearing Restrictions: No Other Position/Activity Restrictions: WBAT   Pertinent Vitals/Pain C/o 7/10 during TE's ICE applied   Mobility  Bed Mobility Bed Mobility: Supine to Sit;Sitting - Scoot to Edge of Bed;Sit to Supine Supine to Sit: 4: Min assist Sitting - Scoot to Edge of Bed: 4: Min assist Details for Bed Mobility Assistance: suppport of L leg to lower to the floor. Transfers Transfers: Sit to Stand;Stand to Sit Sit to Stand: 4: Min assist;From bed;With upper extremity assist;4: Min guard Stand to Sit: 4: Min assist;To chair/3-in-1;With upper extremity assist;With armrests;4: Min guard Details for Transfer Assistance: cues for hand placement and L leg position prior to sitting down. Ambulation/Gait Ambulation/Gait Assistance: 4: Min assist;4: Min Government social research officer (Feet): 145 Feet Assistive device: Rolling walker Ambulation/Gait Assistance Details: 25% VC's on proper walker to self distance and to roll walker vs pick  up. Gait Pattern: Step-to pattern;Antalgic;Decreased stance time - left Gait velocity: decreased    Exercises   Total Knee Replacement TE's 10 reps B LE ankle pumps 10 reps knee presses 10 reps heel slides  10 reps SAQ's 10 reps SLR's 10 reps ABD Followed by ICE   PT Goals (current goals can now be found in the care plan section)    Visit Information  Last PT Received On: 07/21/13 Assistance Needed: +1 History of Present Illness: LTKA    Subjective Data      Cognition       Balance     End of Session PT - End of Session Equipment Utilized During Treatment: Left knee immobilizer Activity Tolerance: Patient tolerated treatment well Patient left: in chair;with call bell/phone within reach   Felecia Shelling  PTA [facility]  Acute  Rehab Pager      [phone]

## 2013-07-21 NOTE — Progress Notes (Signed)
Physical Therapy Treatment Patient Details Name: Mario Edwards MRN: [id number] DOB: 1932/08/09 Today's Date: 07/21/2013 Time: 1410-1435 PT Time Calculation (min): 25 min  PT Assessment / Plan / Recommendation  History of Present Illness LTKA   PT Comments   POD # 2 pm session.  Assisted pt OOB to amb in hallway second time.  Pt c/o increased pain this afternoon vs this morning.  Meds requested and ICE applied.   Follow Up Recommendations  SNF ([name])     Does the patient have the potential to tolerate intense rehabilitation     Barriers to Discharge        Equipment Recommendations  None recommended by PT    Recommendations for Other Services    Frequency 7X/week   Progress towards PT Goals Progress towards PT goals: Progressing toward goals  Plan      Precautions / Restrictions Precautions Precautions: Fall;Knee Precaution Comments: Instructed pt on KI use for amb and when to D/C Required Braces or Orthoses: Knee Immobilizer - Left Restrictions Weight Bearing Restrictions: No Other Position/Activity Restrictions: WBAT    Pertinent Vitals/Pain C/o 8/10 knee pain meds requested  ICE applied    Mobility  Bed Mobility Bed Mobility: Supine to Sit;Sitting - Scoot to Edge of Bed;Sit to Supine Supine to Sit: 4: Min assist Sitting - Scoot to Edge of Bed: 4: Min assist Details for Bed Mobility Assistance: suppport of L leg to lower to the floor. Transfers Transfers: Sit to Stand;Stand to Sit Sit to Stand: 4: Min assist;From bed;With upper extremity assist;4: Min guard Stand to Sit: 4: Min assist;With upper extremity assist;With armrests;4: Min guard;To bed Details for Transfer Assistance: cues for hand placement and L leg position prior to sitting down. Ambulation/Gait Ambulation/Gait Assistance: 4: Min assist;4: Min Government social research officer (Feet): 120 Feet Assistive device: Rolling walker Ambulation/Gait Assistance Details: increased time and increased c/o pain,  meds requested Gait Pattern: Step-to pattern;Antalgic;Decreased stance time - left Gait velocity: decreased     PT Goals (current goals can now be found in the care plan section)    Visit Information  Last PT Received On: 07/21/13 Assistance Needed: +1 History of Present Illness: LTKA    Subjective Data      Cognition       Balance     End of Session PT - End of Session Equipment Utilized During Treatment: Left knee immobilizer Activity Tolerance: Patient limited by pain Patient left: in bed;with call bell/phone within reach Nurse Communication: Patient requests pain meds   Felecia Shelling  PTA [facility]  Acute  Rehab Pager      [phone]

## 2013-07-21 NOTE — Progress Notes (Signed)
   Subjective: 2 Days Post-Op Procedure(s) (LRB): LEFT TOTAL KNEE ARTHROPLASTY (Left) Patient reports pain as mild.   Patient seen in rounds with Dr. Lequita Halt.  Little better today. Patient is well, but has had some minor complaints of pain in the knee, requiring pain medications Plan is to go Skilled nursing facility after hospital stay.  Objective: Vital signs in last 24 hours: Temp:  [97.6 F (36.4 C)-98.4 F (36.9 C)] 97.6 F (36.4 C) (11/05 0620) Pulse Rate:  [58-66] 63 (11/05 0620) Resp:  [16-18] 16 (11/05 0620) BP: (98-157)/(61-70) 133/70 mmHg (11/05 0620) SpO2:  [94 %-98 %] 98 % (11/05 0620)  Intake/Output from previous day:  Intake/Output Summary (Last 24 hours) at 07/21/13 0706 Last data filed at 07/21/13 0620  Gross per 24 hour  Intake 1837.33 ml  Output   2605 ml  Net -767.67 ml    Intake/Output this shift:    Labs:  Recent Labs  07/20/13 0500 07/21/13 0523  HGB 11.3* 10.0*    Recent Labs  07/20/13 0500 07/21/13 0523  WBC 7.4 9.9  RBC 3.52* 3.14*  HCT 32.6* 29.5*  PLT 157 169    Recent Labs  07/20/13 0500 07/21/13 0523  NA 136 134*  K 4.7 4.4  CL 103 101  CO2 26 24  BUN 15 21  CREATININE 1.03 1.21  GLUCOSE 147* 132*  CALCIUM 8.7 9.1   No results found for this basename: LABPT, INR,  in the last 72 hours  EXAM General - Patient is Alert, Appropriate and Oriented Extremity - Neurovascular intact Sensation intact distally Dorsiflexion/Plantar flexion intact No cellulitis present Dressing/Incision - clean, dry, no drainage Motor Function - intact, moving foot and toes well on exam.   Past Medical History  Diagnosis Date  . Coronary artery disease   . Pneumonia     years ago  . Peripheral vascular disease   . Abdominal aortic aneurysm   . GERD (gastroesophageal reflux disease)   . Headache(784.0)     migraines  . Arthritis     Assessment/Plan: 2 Days Post-Op Procedure(s) (LRB): LEFT TOTAL KNEE ARTHROPLASTY  (Left) Principal Problem:   OA (osteoarthritis) of knee Active Problems:   Postoperative anemia due to acute blood loss  Estimated body mass index is 25.22 kg/(m^2) as calculated from the following:   Height as of this encounter: 6' (1.829 m).   Weight as of this encounter: 84.369 kg (186 lb). Up with therapy Plan for discharge tomorrow Discharge to SNF  DVT Prophylaxis - Xarelto Weight-Bearing as tolerated to left leg  PERKINS, ALEXZANDREW 07/21/2013, 7:06 AM

## 2013-07-22 DIAGNOSIS — E871 Hypo-osmolality and hyponatremia: Secondary | ICD-10-CM | POA: Diagnosis present

## 2013-07-22 LAB — CBC
HCT: 26 % — ABNORMAL LOW (ref 39.0–52.0)
Hemoglobin: 9 g/dL — ABNORMAL LOW (ref 13.0–17.0)
MCHC: 34.6 g/dL (ref 30.0–36.0)
RBC: 2.77 MIL/uL — ABNORMAL LOW (ref 4.22–5.81)
RDW: 13.7 % (ref 11.5–15.5)
WBC: 6.6 10*3/uL (ref 4.0–10.5)

## 2013-07-22 MED ORDER — TRAMADOL HCL 50 MG PO TABS: 50.0000 mg | ORAL_TABLET | Freq: Four times a day (QID) | ORAL | Status: DC | PRN

## 2013-07-22 MED ORDER — DSS 100 MG PO CAPS: 100.0000 mg | ORAL_CAPSULE | Freq: Two times a day (BID) | ORAL | Status: AC

## 2013-07-22 MED ORDER — OXYCODONE HCL 5 MG PO TABS: 5.0000 mg | ORAL_TABLET | ORAL | Status: DC | PRN

## 2013-07-22 MED ORDER — BISACODYL 10 MG RE SUPP: 10.0000 mg | Freq: Every day | RECTAL | Status: AC | PRN

## 2013-07-22 MED ORDER — POLYETHYLENE GLYCOL 3350 17 G PO PACK: 17.0000 g | PACK | Freq: Every day | ORAL | Status: AC | PRN

## 2013-07-22 MED ORDER — ACETAMINOPHEN 325 MG PO TABS: 650.0000 mg | ORAL_TABLET | Freq: Four times a day (QID) | ORAL | Status: AC | PRN

## 2013-07-22 MED ORDER — ONDANSETRON HCL 4 MG PO TABS: 4.0000 mg | ORAL_TABLET | Freq: Four times a day (QID) | ORAL | Status: DC | PRN

## 2013-07-22 MED ORDER — RIVAROXABAN 10 MG PO TABS: 10.0000 mg | ORAL_TABLET | Freq: Every day | ORAL | Status: DC

## 2013-07-22 MED ORDER — METHOCARBAMOL 500 MG PO TABS: 500.0000 mg | ORAL_TABLET | Freq: Four times a day (QID) | ORAL | Status: DC | PRN

## 2013-07-22 MED ORDER — METOCLOPRAMIDE HCL 5 MG PO TABS: 5.0000 mg | ORAL_TABLET | Freq: Three times a day (TID) | ORAL | Status: DC | PRN

## 2013-07-22 NOTE — Progress Notes (Signed)
Physical Therapy Treatment Patient Details Name: Mario Edwards MRN: [id number] DOB: 1932-05-20 Today's Date: 07/22/2013 Time: 4098-1191 PT Time Calculation (min): 14 min  PT Assessment / Plan / Recommendation  History of Present Illness LTKA   PT Comments   POD # 3 pt plans to D/C to SNF forRehab today.  Assisted pt OOB to static stand to attempt to void however pt had difficulty and stood x 5 min before success.  Too fatigued to amb only took a few steps to recliner.  Applied ICE.   Follow Up Recommendations  SNF     Does the patient have the potential to tolerate intense rehabilitation     Barriers to Discharge        Equipment Recommendations       Recommendations for Other Services    Frequency 7X/week   Progress towards PT Goals Progress towards PT goals: Progressing toward goals  Plan      Precautions / Restrictions Precautions Precautions: Fall;Knee Precaution Comments: Instructed pt on KI use for amb and when to D/C Required Braces or Orthoses: Knee Immobilizer - Left Restrictions Weight Bearing Restrictions: No Other Position/Activity Restrictions: WBAT    Pertinent Vitals/Pain C/o MAX fatigue and 6/10 pain ICE applied     Mobility  Bed Mobility Bed Mobility: Supine to Sit;Sitting - Scoot to Edge of Bed;Sit to Supine Supine to Sit: 4: Min assist Sitting - Scoot to Edge of Bed: 4: Min assist Details for Bed Mobility Assistance: suppport of L leg to lower to the floor. Transfers Transfers: Sit to Stand;Stand to Sit Sit to Stand: 4: Min assist;From bed;With upper extremity assist;4: Min guard;From elevated surface Stand to Sit: 4: Min assist;With upper extremity assist;With armrests;4: Min guard;To bed Details for Transfer Assistance: cues for hand placement and L leg position prior to sitting down. Ambulation/Gait Ambulation/Gait Assistance: 4: Min assist;4: Min Government social research officer (Feet): 2 Feet Assistive device: Rolling walker Ambulation/Gait  Assistance Details: only a few steps to recliner this am due to fatigue from static standing x to try to void.       PT Goals (current goals can now be found in the care plan section)    Visit Information  Last PT Received On: 07/22/13 Assistance Needed: +1 History of Present Illness: LTKA    Subjective Data      Cognition       Balance     End of Session PT - End of Session Equipment Utilized During Treatment: Left knee immobilizer Activity Tolerance: Patient limited by pain;Patient limited by fatigue Patient left: in chair;with call bell/phone within reach CPM Left Knee CPM Left Knee: Off   Felecia Shelling  PTA [facility]  Acute  Rehab Pager      [phone]

## 2013-07-22 NOTE — Progress Notes (Signed)
Clinical Social Work Department CLINICAL SOCIAL WORK PLACEMENT NOTE 07/22/2013  Patient:  Mario Edwards, Mario Edwards  Account Number:  [id number] Admit date:  07/19/2013  Clinical Social Worker:  Cori Razor, LCSW  Date/time:  07/20/2013 11:49 AM  Clinical Social Work is seeking post-discharge placement for this patient at the following level of care:   SKILLED NURSING   (*CSW will update this form in Epic as items are completed)     Patient/family provided with [facility] Department of Clinical Social Work's list of facilities offering this level of care within the geographic area requested by the patient (or if unable, by the patient's family).  07/20/2013  Patient/family informed of their freedom to choose among providers that offer the needed level of care, that participate in Medicare, Medicaid or managed care program needed by the patient, have an available bed and are willing to accept the patient.    Patient/family informed of [facility]' ownership interest in [facility], as well as of the fact that they are under no obligation to receive care at this facility.  PASARR submitted to [employer] on 07/20/2013 PASARR number received from [employer] on 07/21/2013  FL2 transmitted to all facilities in geographic area requested by pt/family on  07/20/2013 FL2 transmitted to all facilities within larger geographic area on   Patient informed that his/her managed care company has contracts with or will negotiate with  certain facilities, including the following:     Patient/family informed of bed offers received:  07/20/2013 Patient chooses bed at [facility], [place] Physician recommends and patient chooses bed at    [facility] [place] on  07/22/2013 Patient to be transferred to facility by [facility]  The following physician request were entered in Epic:   Additional Comments: Pt is aware that ambulance transport may not be  covered by insurance.  Cori Razor LCSW [phone]

## 2013-07-22 NOTE — Discharge Summary (Signed)
Physician Discharge Summary   Patient ID: [name] MRN: [id number] DOB/AGE: 05/25/1932 79 y.o.  Admit date: 07/19/2013 Discharge date: 07/22/2013  Primary Diagnosis:  Osteoarthritis Left knee(s)  Admission Diagnoses:  Past Medical History  Diagnosis Date  . Coronary artery disease   . Pneumonia     years ago  . Peripheral vascular disease   . Abdominal aortic aneurysm   . GERD (gastroesophageal reflux disease)   . Headache(784.0)     migraines  . Arthritis    Discharge Diagnoses:   Principal Problem:   OA (osteoarthritis) of knee Active Problems:   Postoperative anemia due to acute blood loss   Hyponatremia  Estimated body mass index is 25.22 kg/(m^2) as calculated from the following:   Height as of this encounter: 6' (1.829 m).   Weight as of this encounter: 84.369 kg (186 lb).  Procedure:  Procedure(s) (LRB): LEFT TOTAL KNEE ARTHROPLASTY (Left)   Consults: None  HPI: Mario Edwards is a 79 y.o. year old male with end stage OA of his left knee with progressively worsening pain and dysfunction. He has constant pain, with activity and at rest and significant functional deficits with difficulties even with ADLs. He has had extensive non-op management including analgesics, injections of cortisone and viscosupplements, and home exercise program, but remains in significant pain with significant dysfunction. Radiographs show bone on bone arthritis medial and patellofemoral. He presents now for left Total Knee Arthroplasty.   Laboratory Data: Admission on 07/19/2013  Component Date Value Range Status  . WBC 07/20/2013 7.4  4.0 - 10.5 K/uL Final  . RBC 07/20/2013 3.52* 4.22 - 5.81 MIL/uL Final  . Hemoglobin 07/20/2013 11.3* 13.0 - 17.0 g/dL Final  . HCT 14/78/2956 32.6* 39.0 - 52.0 % Final  . MCV 07/20/2013 92.6  78.0 - 100.0 fL Final  . MCH 07/20/2013 32.1  26.0 - 34.0 pg Final  . MCHC 07/20/2013 34.7  30.0 - 36.0 g/dL Final  . RDW 21/30/8657 12.9  11.5 - 15.5 % Final    . Platelets 07/20/2013 157  150 - 400 K/uL Final  . Sodium 07/20/2013 136  135 - 145 mEq/L Final  . Potassium 07/20/2013 4.7  3.5 - 5.1 mEq/L Final  . Chloride 07/20/2013 103  96 - 112 mEq/L Final  . CO2 07/20/2013 26  19 - 32 mEq/L Final  . Glucose, Bld 07/20/2013 147* 70 - 99 mg/dL Final  . BUN 84/69/6295 15  6 - 23 mg/dL Final  . Creatinine, Ser 07/20/2013 1.03  0.50 - 1.35 mg/dL Final  . Calcium 28/41/3244 8.7  8.4 - 10.5 mg/dL Final  . GFR calc non Af Amer 07/20/2013 67* >90 mL/min Final  . GFR calc Af Amer 07/20/2013 78* >90 mL/min Final   Comment: (NOTE)                          The eGFR has been calculated using the CKD EPI equation.                          This calculation has not been validated in all clinical situations.                          eGFR's persistently <90 mL/min signify possible Chronic Kidney  Disease.  . WBC 07/21/2013 9.9  4.0 - 10.5 K/uL Final  . RBC 07/21/2013 3.14* 4.22 - 5.81 MIL/uL Final  . Hemoglobin 07/21/2013 10.0* 13.0 - 17.0 g/dL Final  . HCT 23/55/7322 29.5* 39.0 - 52.0 % Final  . MCV 07/21/2013 93.9  78.0 - 100.0 fL Final  . MCH 07/21/2013 31.8  26.0 - 34.0 pg Final  . MCHC 07/21/2013 33.9  30.0 - 36.0 g/dL Final  . RDW 02/54/2706 13.6  11.5 - 15.5 % Final  . Platelets 07/21/2013 169  150 - 400 K/uL Final  . Sodium 07/21/2013 134* 135 - 145 mEq/L Final  . Potassium 07/21/2013 4.4  3.5 - 5.1 mEq/L Final  . Chloride 07/21/2013 101  96 - 112 mEq/L Final  . CO2 07/21/2013 24  19 - 32 mEq/L Final  . Glucose, Bld 07/21/2013 132* 70 - 99 mg/dL Final  . BUN 23/76/2831 21  6 - 23 mg/dL Final  . Creatinine, Ser 07/21/2013 1.21  0.50 - 1.35 mg/dL Final  . Calcium 51/76/1607 9.1  8.4 - 10.5 mg/dL Final  . GFR calc non Af Amer 07/21/2013 56* >90 mL/min Final  . GFR calc Af Amer 07/21/2013 64* >90 mL/min Final   Comment: (NOTE)                          The eGFR has been calculated using the CKD EPI equation.                           This calculation has not been validated in all clinical situations.                          eGFR's persistently <90 mL/min signify possible Chronic Kidney                          Disease.  . WBC 07/22/2013 6.6  4.0 - 10.5 K/uL Final  . RBC 07/22/2013 2.77* 4.22 - 5.81 MIL/uL Final  . Hemoglobin 07/22/2013 9.0* 13.0 - 17.0 g/dL Final  . HCT 37/06/6268 26.0* 39.0 - 52.0 % Final  . MCV 07/22/2013 93.9  78.0 - 100.0 fL Final  . MCH 07/22/2013 32.5  26.0 - 34.0 pg Final  . MCHC 07/22/2013 34.6  30.0 - 36.0 g/dL Final  . RDW 48/54/6270 13.7  11.5 - 15.5 % Final  . Platelets 07/22/2013 150  150 - 400 K/uL Final  Hospital Outpatient Visit on 07/14/2013  Component Date Value Range Status  . MRSA, PCR 07/14/2013 NEGATIVE  NEGATIVE Final  . Staphylococcus aureus 07/14/2013 NEGATIVE  NEGATIVE Final   Comment:                                 The Xpert SA Assay (FDA                          approved for NASAL specimens                          in patients over 44 years of age),                          is one component of  a comprehensive surveillance                          program.  Test performance has                          been validated by [facility] for patients greater                          than or equal to 76 year old.                          It is not intended                          to diagnose infection nor to                          guide or monitor treatment.  Marland Kitchen aPTT 07/14/2013 32  24 - 37 seconds Final   Performed at [facility]  . WBC 07/14/2013 5.6  4.0 - 10.5 K/uL Final  . RBC 07/14/2013 4.36  4.22 - 5.81 MIL/uL Final  . Hemoglobin 07/14/2013 14.0  13.0 - 17.0 g/dL Final  . HCT 16/06/9603 40.6  39.0 - 52.0 % Final  . MCV 07/14/2013 93.1  78.0 - 100.0 fL Final  . MCH 07/14/2013 32.1  26.0 - 34.0 pg Final  . MCHC 07/14/2013 34.5  30.0 - 36.0 g/dL Final  . RDW 54/05/8118 12.9  11.5 - 15.5 % Final  .  Platelets 07/14/2013 197  150 - 400 K/uL Final  . Sodium 07/14/2013 134* 135 - 145 mEq/L Final  . Potassium 07/14/2013 5.0  3.5 - 5.1 mEq/L Final  . Chloride 07/14/2013 101  96 - 112 mEq/L Final  . CO2 07/14/2013 28  19 - 32 mEq/L Final  . Glucose, Bld 07/14/2013 98  70 - 99 mg/dL Final  . BUN 14/78/2956 18  6 - 23 mg/dL Final  . Creatinine, Ser 07/14/2013 1.18  0.50 - 1.35 mg/dL Final  . Calcium 21/30/8657 10.1  8.4 - 10.5 mg/dL Final  . Total Protein 07/14/2013 7.3  6.0 - 8.3 g/dL Final  . Albumin 84/69/6295 4.3  3.5 - 5.2 g/dL Final  . AST 28/41/3244 27  0 - 37 U/L Final  . ALT 07/14/2013 23  0 - 53 U/L Final  . Alkaline Phosphatase 07/14/2013 76  39 - 117 U/L Final  . Total Bilirubin 07/14/2013 0.6  0.3 - 1.2 mg/dL Final  . GFR calc non Af Amer 07/14/2013 57* >90 mL/min Final  . GFR calc Af Amer 07/14/2013 66* >90 mL/min Final   Comment: (NOTE)                          The eGFR has been calculated using the CKD EPI equation.                          This calculation has not been validated in all clinical situations.  eGFR's persistently <90 mL/min signify possible Chronic Kidney                          Disease.  Marland Kitchen Prothrombin Time 07/14/2013 12.9  11.6 - 15.2 seconds Final  . INR 07/14/2013 0.99  0.00 - 1.49 Final   Performed at [facility]  . ABO/RH(D) 07/14/2013 O POS   Final  . Antibody Screen 07/14/2013 NEG   Final  . Sample Expiration 07/14/2013 07/22/2013   Final  . Color, Urine 07/14/2013 YELLOW  YELLOW Final  . APPearance 07/14/2013 CLEAR  CLEAR Final  . Specific Gravity, Urine 07/14/2013 1.016  1.005 - 1.030 Final  . pH 07/14/2013 7.0  5.0 - 8.0 Final  . Glucose, UA 07/14/2013 NEGATIVE  NEGATIVE mg/dL Final  . Hgb urine dipstick 07/14/2013 NEGATIVE  NEGATIVE Final  . Bilirubin Urine 07/14/2013 NEGATIVE  NEGATIVE Final  . Ketones, ur 07/14/2013 NEGATIVE  NEGATIVE mg/dL Final  . Protein, ur 14/78/2956 NEGATIVE  NEGATIVE mg/dL Final  .  Urobilinogen, UA 07/14/2013 0.2  0.0 - 1.0 mg/dL Final  . Nitrite 21/30/8657 NEGATIVE  NEGATIVE Final  . Leukocytes, UA 07/14/2013 NEGATIVE  NEGATIVE Final   MICROSCOPIC NOT DONE ON URINES WITH NEGATIVE PROTEIN, BLOOD, LEUKOCYTES, NITRITE, OR GLUCOSE <1000 mg/dL.     X-Rays:Dg Chest 2 View  07/14/2013   CLINICAL DATA:  Preop for total knee replacement surgery.  EXAM: CHEST  2 VIEW  COMPARISON:  06/21/2010.  FINDINGS: The cardiac silhouette, mediastinal and hilar contours are within normal limits and stable. The lungs are clear. Stable surgical changes related to bypass surgery aortic calcifications are noted. The bony thorax is intact.  IMPRESSION: No acute cardiopulmonary findings.   Electronically Signed   By: Loralie Champagne M.D.   On: 07/14/2013 11:48    EKG:No orders found for this or any previous visit.   Hospital Course: ARMAAN POND is a 79 y.o. who was admitted to [facility]. They were brought to the operating room on 07/19/2013 and underwent Procedure(s): LEFT TOTAL KNEE ARTHROPLASTY.  Patient tolerated the procedure well and was later transferred to the recovery room and then to the orthopaedic floor for postoperative care.  They were given PO and IV analgesics for pain control following their surgery.  They were given 24 hours of postoperative antibiotics of  Anti-infectives   Start     Dose/Rate Route Frequency Ordered Stop   07/19/13 2100  ceFAZolin (ANCEF) IVPB 2 g/50 mL premix     2 g 100 mL/hr over 30 Minutes Intravenous Every 6 hours 07/19/13 1824 07/20/13 0232   07/19/13 1145  ceFAZolin (ANCEF) IVPB 2 g/50 mL premix     2 g 100 mL/hr over 30 Minutes Intravenous On call to O.R. 07/19/13 1132 07/19/13 1535     and started on DVT prophylaxis in the form of Xarelto.   PT and OT were ordered for total joint protocol.  Discharge planning consulted to help with postop disposition and equipment needs.  Patient had a good night on the evening of surgery.  They started to get  up OOB with therapy on day one walking over 100 feet. Hemovac drain was pulled without difficulty.  Continued to work with therapy into day two walking over 140 feet.  Dressing was changed on day two and the incision was healing well.  By day three, the patient had progressed with therapy and meeting their goals.  Incision was healing well.  Patient was seen in rounds and was ready to go to the SNF.   Discharge Medications: Prior to Admission medications   Medication Sig Start Date End Date Taking? Authorizing Provider  atorvastatin (LIPITOR) 40 MG tablet Take 40 mg by mouth every morning.   Yes Historical Provider, MD  metoprolol succinate (TOPROL-XL) 50 MG 24 hr tablet Take 50 mg by mouth every morning. Take with or immediately following a meal.   Yes Historical Provider, MD  ranitidine (ZANTAC) 150 MG tablet Take 150 mg by mouth daily.   Yes Historical Provider, MD  acetaminophen (TYLENOL) 325 MG tablet Take 2 tablets (650 mg total) by mouth every 6 (six) hours as needed for mild pain (or Fever >/= 101). 07/22/13   Alexzandrew Julien Girt, PA-C  bisacodyl (DULCOLAX) 10 MG suppository Place 1 suppository (10 mg total) rectally daily as needed for moderate constipation. 07/22/13   Alexzandrew Perkins, PA-C  docusate sodium 100 MG CAPS Take 100 mg by mouth 2 (two) times daily. 07/22/13   Alexzandrew Perkins, PA-C  methocarbamol (ROBAXIN) 500 MG tablet Take 1 tablet (500 mg total) by mouth every 6 (six) hours as needed for muscle spasms. 07/22/13   Alexzandrew Julien Girt, PA-C  metoCLOPramide (REGLAN) 5 MG tablet Take 1-2 tablets (5-10 mg total) by mouth every 8 (eight) hours as needed for nausea (if ondansetron (ZOFRAN) ineffective.). 07/22/13   Alexzandrew Perkins, PA-C  ondansetron (ZOFRAN) 4 MG tablet Take 1 tablet (4 mg total) by mouth every 6 (six) hours as needed for nausea. 07/22/13   Alexzandrew Perkins, PA-C  oxyCODONE (OXY IR/ROXICODONE) 5 MG immediate release tablet Take 1-2 tablets (5-10 mg total) by  mouth every 3 (three) hours as needed for breakthrough pain. 07/22/13   Alexzandrew Perkins, PA-C  polyethylene glycol (MIRALAX / GLYCOLAX) packet Take 17 g by mouth daily as needed for mild constipation. 07/22/13   Alexzandrew Julien Girt, PA-C  rivaroxaban (XARELTO) 10 MG TABS tablet Take 1 tablet (10 mg total) by mouth daily with breakfast. Take Xarelto for two and a half more weeks, then discontinue Xarelto. Once the patient has completed the Xarelto, they may resume the 325 mg Aspirin. 07/22/13   Alexzandrew Perkins, PA-C  traMADol (ULTRAM) 50 MG tablet Take 1-2 tablets (50-100 mg total) by mouth every 6 (six) hours as needed for moderate pain. 07/22/13   Alexzandrew Julien Girt, PA-C   Discharge to SNF  Diet - Cardiac diet  Follow up - in 2 weeks  Activity - WBAT  Disposition - Skilled nursing facility - [facility]  Condition Upon Discharge - Good  D/C Meds - See DC Summary  DVT Prophylaxis - Xarelto      Discharge Orders   Future Orders Complete By Expires   Call MD / Call 911  As directed    Comments:     If you experience chest pain or shortness of breath, CALL 911 and be transported to the hospital emergency room.  If you develope a fever above 101 F, pus (white drainage) or increased drainage or redness at the wound, or calf pain, call your surgeon's office.   Change dressing  As directed    Comments:     Change dressing daily with sterile 4 x 4 inch gauze dressing and apply TED hose. Do not submerge the incision under water.   Constipation Prevention  As directed    Comments:     Drink plenty of fluids.  Prune juice may be helpful.  You may use a stool softener, such  as Colace (over the counter) 100 mg twice a day.  Use MiraLax (over the counter) for constipation as needed.   Diet - low sodium heart healthy  As directed    Discharge instructions  As directed    Comments:     Pick up stool softner and laxative for home. Do not submerge incision under water. May  shower. Continue to use ice for pain and swelling from surgery.  Take Xarelto for two and a half more weeks, then discontinue Xarelto. Once the patient has completed the Xarelto, they may resume the 325 mg Aspirin.  When discharged from the skilled rehab facility, please have the facility set up the patient's Home Health Physical Therapy prior to being released.  Also provide the patient with their medications at time of release from the facility to include their pain medication, the muscle relaxants, and their blood thinner medication.  If the patient is still at the rehab facility at time of follow up appointment, please also assist the patient in arranging follow up appointment in our office and any transportation needs.   Do not put a pillow under the knee. Place it under the heel.  As directed    Do not sit on low chairs, stoools or toilet seats, as it may be difficult to get up from low surfaces  As directed    Driving restrictions  As directed    Comments:     No driving until released by the physician.   Increase activity slowly as tolerated  As directed    Lifting restrictions  As directed    Comments:     No lifting until released by the physician.   Patient may shower  As directed    Comments:     You may shower without a dressing once there is no drainage.  Do not wash over the wound.  If drainage remains, do not shower until drainage stops.   TED hose  As directed    Comments:     Use stockings (TED hose) for 3 weeks on both leg(s).  You may remove them at night for sleeping.   Weight bearing as tolerated  As directed    Questions:     Laterality:     Extremity:         Medication List    STOP taking these medications       aspirin 325 MG tablet     calcium-vitamin D 500-200 MG-UNIT per tablet  Commonly known as:  OSCAL WITH D      TAKE these medications       acetaminophen 325 MG tablet  Commonly known as:  TYLENOL  Take 2 tablets (650 mg total) by mouth every  6 (six) hours as needed for mild pain (or Fever >/= 101).     atorvastatin 40 MG tablet  Commonly known as:  LIPITOR  Take 40 mg by mouth every morning.     bisacodyl 10 MG suppository  Commonly known as:  DULCOLAX  Place 1 suppository (10 mg total) rectally daily as needed for moderate constipation.     DSS 100 MG Caps  Take 100 mg by mouth 2 (two) times daily.     methocarbamol 500 MG tablet  Commonly known as:  ROBAXIN  Take 1 tablet (500 mg total) by mouth every 6 (six) hours as needed for muscle spasms.     metoCLOPramide 5 MG tablet  Commonly known as:  REGLAN  Take 1-2 tablets (5-10 mg  total) by mouth every 8 (eight) hours as needed for nausea (if ondansetron (ZOFRAN) ineffective.).     metoprolol succinate 50 MG 24 hr tablet  Commonly known as:  TOPROL-XL  Take 50 mg by mouth every morning. Take with or immediately following a meal.     ondansetron 4 MG tablet  Commonly known as:  ZOFRAN  Take 1 tablet (4 mg total) by mouth every 6 (six) hours as needed for nausea.     oxyCODONE 5 MG immediate release tablet  Commonly known as:  Oxy IR/ROXICODONE  Take 1-2 tablets (5-10 mg total) by mouth every 3 (three) hours as needed for breakthrough pain.     polyethylene glycol packet  Commonly known as:  MIRALAX / GLYCOLAX  Take 17 g by mouth daily as needed for mild constipation.     ranitidine 150 MG tablet  Commonly known as:  ZANTAC  Take 150 mg by mouth daily.     rivaroxaban 10 MG Tabs tablet  Commonly known as:  XARELTO  - Take 1 tablet (10 mg total) by mouth daily with breakfast. Take Xarelto for two and a half more weeks, then discontinue Xarelto.  - Once the patient has completed the Xarelto, they may resume the 325 mg Aspirin.     traMADol 50 MG tablet  Commonly known as:  ULTRAM  Take 1-2 tablets (50-100 mg total) by mouth every 6 (six) hours as needed for moderate pain.       Follow-up Information   Follow up with Loanne Drilling, MD. Schedule an  appointment as soon as possible for a visit on 08/03/2013. (Call [phone] tomorrow to make the appointment)    Specialty:  Orthopedic Surgery   Contact information:   [address] [place] [address] [phone]       Signed: Patrica Duel 07/22/2013, 8:47 AM

## 2013-07-22 NOTE — Progress Notes (Signed)
Report called to [name] and given to Purcell Mouton RN

## 2013-07-22 NOTE — Anesthesia Postprocedure Evaluation (Signed)
  Anesthesia Post-op Note  Patient: Mario Edwards  Procedure(s) Performed: Procedure(s) (LRB): LEFT TOTAL KNEE ARTHROPLASTY (Left)  Patient Location: PACU  Anesthesia Type: Spinal  Level of Consciousness: awake and alert   Airway and Oxygen Therapy: Patient Spontanous Breathing  Post-op Pain: mild  Post-op Assessment: Post-op Vital signs reviewed, Patient's Cardiovascular Status Stable, Respiratory Function Stable, Patent Airway and No signs of Nausea or vomiting  Last Vitals:  Filed Vitals:   07/22/13 0535  BP: 91/58  Pulse: 66  Temp: 36.6 C  Resp: 14    Post-op Vital Signs: stable   Complications: No apparent anesthesia complications

## 2013-07-22 NOTE — Progress Notes (Signed)
   Subjective: 3 Days Post-Op Procedure(s) (LRB): LEFT TOTAL KNEE ARTHROPLASTY (Left) Patient reports pain as mild.   Patient seen in rounds by Dr. Lequita Halt. Patient is well, and has had no acute complaints or problems Patient is ready to go to SNF  Objective: Vital signs in last 24 hours: Temp:  [97.3 F (36.3 C)-98.2 F (36.8 C)] 97.8 F (36.6 C) (11/06 0535) Pulse Rate:  [66-68] 66 (11/06 0535) Resp:  [14-16] 14 (11/06 0535) BP: (91-134)/(58-69) 91/58 mmHg (11/06 0535) SpO2:  [95 %-100 %] 95 % (11/06 0535)  Intake/Output from previous day:  Intake/Output Summary (Last 24 hours) at 07/22/13 0839 Last data filed at 07/22/13 0444  Gross per 24 hour  Intake    240 ml  Output   1125 ml  Net   -885 ml    Intake/Output this shift:    Labs:  Recent Labs  07/20/13 0500 07/21/13 0523 07/22/13 0430  HGB 11.3* 10.0* 9.0*    Recent Labs  07/21/13 0523 07/22/13 0430  WBC 9.9 6.6  RBC 3.14* 2.77*  HCT 29.5* 26.0*  PLT 169 150    Recent Labs  07/20/13 0500 07/21/13 0523  NA 136 134*  K 4.7 4.4  CL 103 101  CO2 26 24  BUN 15 21  CREATININE 1.03 1.21  GLUCOSE 147* 132*  CALCIUM 8.7 9.1   No results found for this basename: LABPT, INR,  in the last 72 hours  EXAM: General - Patient is Alert, Appropriate and Oriented Extremity - Neurovascular intact Sensation intact distally Incision - clean, dry, no drainage, healing Motor Function - intact, moving foot and toes well on exam.   Assessment/Plan: 3 Days Post-Op Procedure(s) (LRB): LEFT TOTAL KNEE ARTHROPLASTY (Left) Procedure(s) (LRB): LEFT TOTAL KNEE ARTHROPLASTY (Left) Past Medical History  Diagnosis Date  . Coronary artery disease   . Pneumonia     years ago  . Peripheral vascular disease   . Abdominal aortic aneurysm   . GERD (gastroesophageal reflux disease)   . Headache(784.0)     migraines  . Arthritis    Principal Problem:   OA (osteoarthritis) of knee Active Problems:   Postoperative  anemia due to acute blood loss   Hyponatremia  Estimated body mass index is 25.22 kg/(m^2) as calculated from the following:   Height as of this encounter: 6' (1.829 m).   Weight as of this encounter: 84.369 kg (186 lb). Discharge to SNF Diet - Cardiac diet Follow up - in 2 weeks Activity - WBAT Disposition - Skilled nursing facility - [facility] Condition Upon Discharge - Good D/C Meds - See DC Summary DVT Prophylaxis - Xarelto  Edrik Rundle 07/22/2013, 8:39 AM

## 2014-09-17 IMAGING — CR DG CHEST 2V
3 series · 3 of 3 positions shown · non-contrast
Comparison: 06/21/2010.

CLINICAL DATA: Preop for total knee replacement surgery.

EXAM:
CHEST  2 VIEW

[w chest pa]
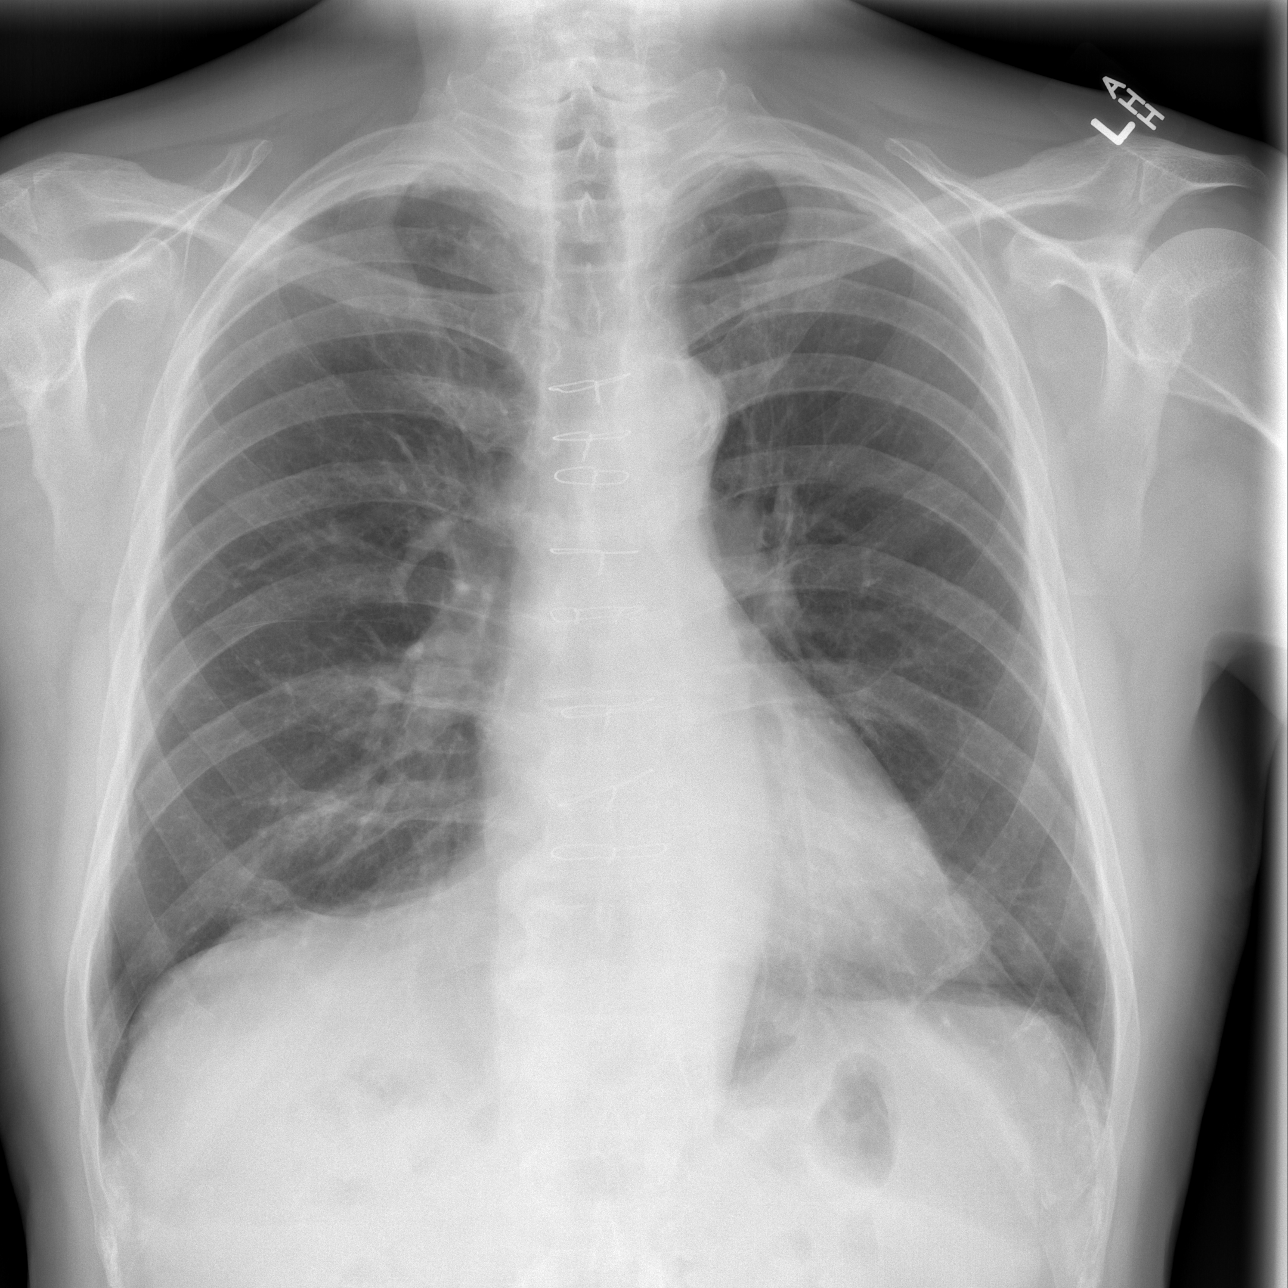

[w chest lat (1 of 2)]
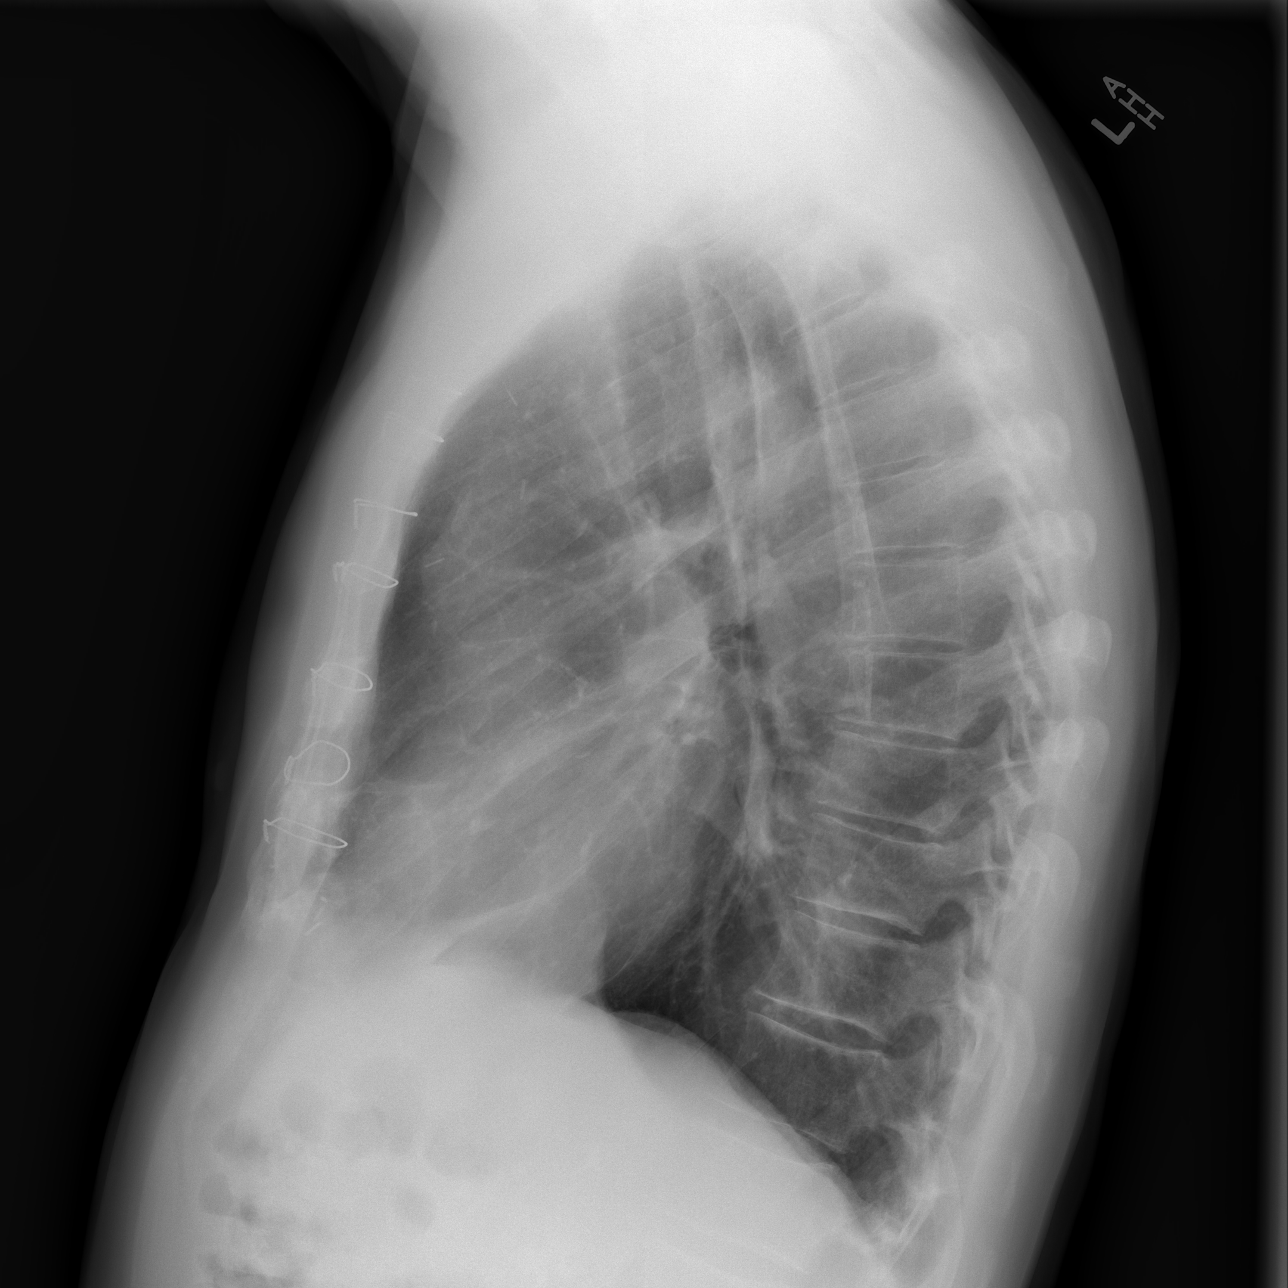

[w chest lat (2 of 2)]
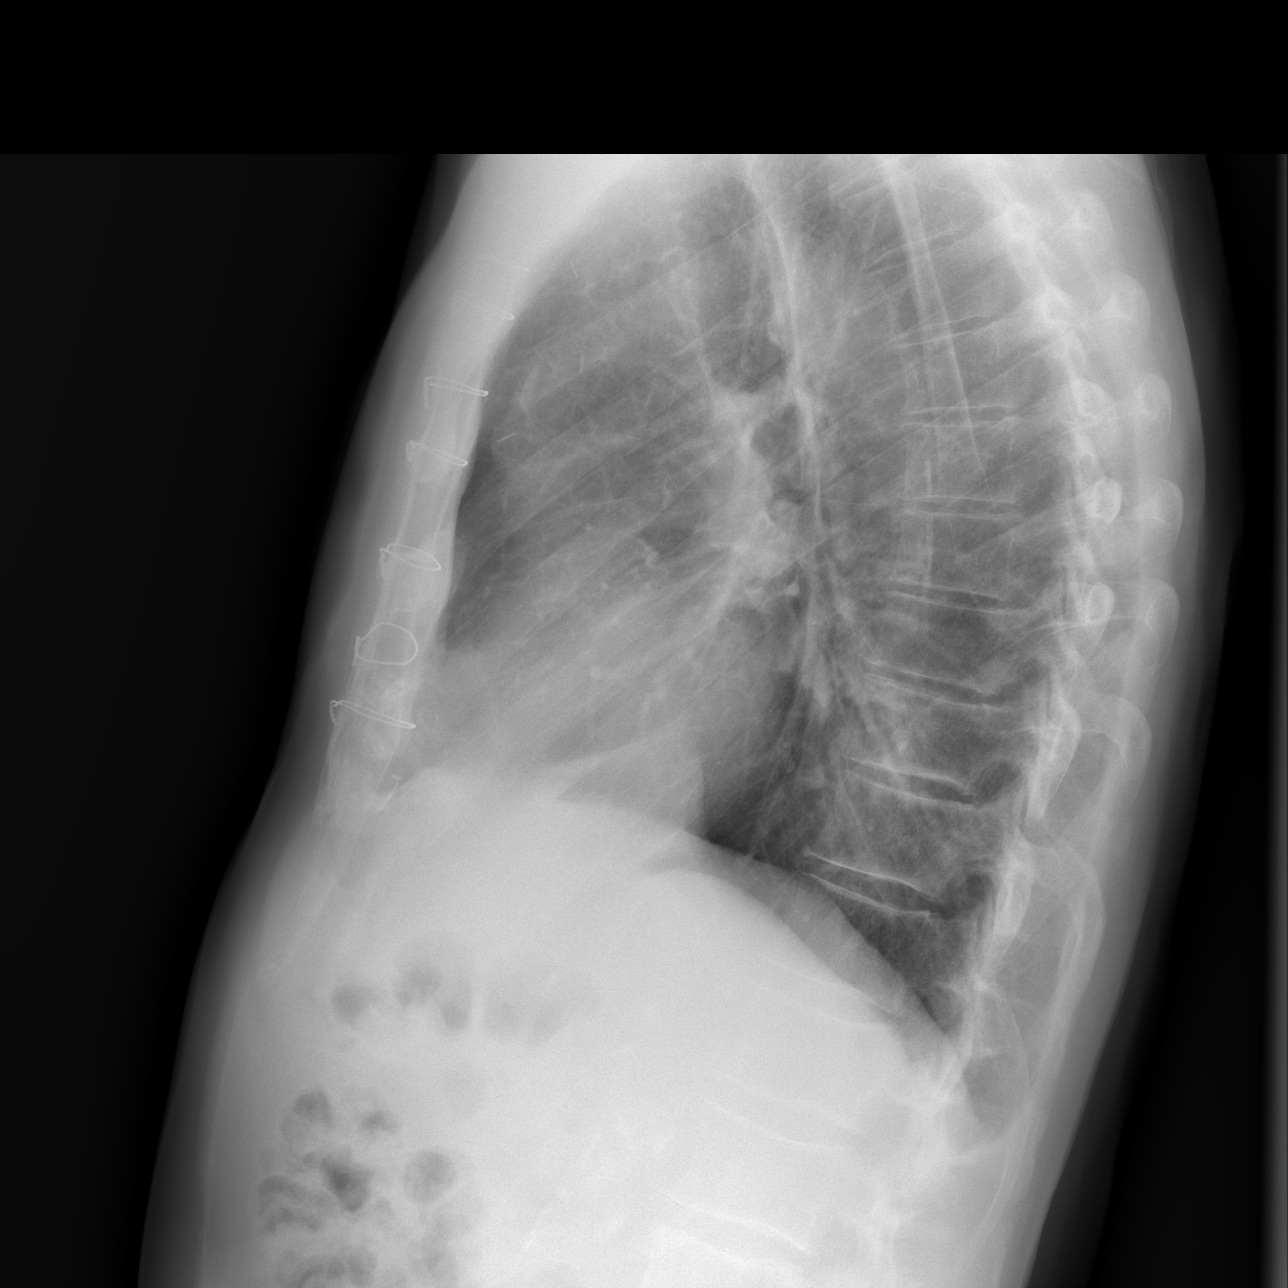

[3 of 3 positions shown; findings below may reference images not displayed]

FINDINGS: The cardiac silhouette, mediastinal and hilar contours are within
normal limits and stable. The lungs are clear. Stable surgical
changes related to bypass surgery aortic calcifications are noted.
The bony thorax is intact.
IMPRESSION: No acute cardiopulmonary findings.

## 2017-06-20 ENCOUNTER — Ambulatory Visit: Payer: Self-pay | Admitting: Orthopedic Surgery

## 2017-07-07 NOTE — Progress Notes (Signed)
07-07-17 Spoke to Jeffersonhris at Dr. Ledell Nossheek's office to obtain a copy of the 05-21-17 EKG tracing. Per Thayer Ohmhris, the EKG tracing is not available, only the report. Thayer OhmChris was not sure why it was not visible.

## 2017-07-07 NOTE — Patient Instructions (Addendum)
Mario Edwards  07/07/2017   Your procedure is scheduled on: 07-14-17  [facility]  Entrance Report to Admitting at 8:00 AM   Call this number if you have problems the morning of surgery [phone]   Remember: ONLY 1 PERSON MAY GO WITH YOU TO SHORT STAY TO GET  READY MORNING OF YOUR SURGERY.  Do not eat food or drink liquids :After Midnight.     Take these medicines the morning of surgery with A SIP OF WATER: Atorvastatin (Lipitor) and Metoprolol (Toprol-XL), and Zantac                                 You may not have any metal on your body including hair pins and              piercings  Do not wear jewelry, lotions, powders or perfumes, deodorant             Men may shave face and neck.   Do not bring valuables to the hospital. [facility] IS NOT             RESPONSIBLE   FOR VALUABLES.  Contacts, dentures or bridgework may not be worn into surgery.  Leave suitcase in the car. After surgery it may be brought to your room.                Please read over the following fact sheets you were given: _____________________________________________________________________             [facility] - Preparing [facility] Before surgery, you can play an important role.  Because skin is not sterile, your skin needs to be as free of germs as possible.  You can reduce the number of germs on your skin by washing with CHG (chlorahexidine gluconate) soap before surgery.  CHG is an antiseptic cleaner which kills germs and bonds with the skin to continue killing germs even after washing. Please DO NOT use if you have an allergy to CHG or antibacterial soaps.  If your skin becomes reddened/irritated stop using the CHG and inform your nurse when you arrive at Short Stay. Do not shave (including legs and underarms) for at least 48 hours prior to the first CHG shower.  You may shave your face/neck. Please follow these instructions carefully:  1.  Shower with CHG  Soap the night before surgery and the  morning of Surgery.  2.  If you choose to wash your hair, wash your hair first as usual with your  normal  shampoo.  3.  After you shampoo, rinse your hair and body thoroughly to remove the  shampoo.                           4.  Use CHG as you would any other liquid soap.  You can apply chg directly  to the skin and wash                       Gently with a scrungie or clean washcloth.  5.  Apply the CHG Soap to your body ONLY FROM THE NECK DOWN.   Do not use on face/ open  Wound or open sores. Avoid contact with eyes, ears mouth and genitals (private parts).                       Wash face,  Genitals (private parts) with your normal soap.             6.  Wash thoroughly, paying special attention to the area where your surgery  will be performed.  7.  Thoroughly rinse your body with warm water from the neck down.  8.  DO NOT shower/wash with your normal soap after using and rinsing off  the CHG Soap.                9.  Pat yourself dry with a clean towel.            10.  Wear clean pajamas.            11.  Place clean sheets on your bed the night of your first shower and do not  sleep with pets. Day of Surgery : Do not apply any lotions/deodorants the morning of surgery.  Please wear clean clothes to the hospital/surgery center.  FAILURE TO FOLLOW THESE INSTRUCTIONS MAY RESULT IN THE CANCELLATION OF YOUR SURGERY PATIENT SIGNATURE_________________________________  NURSE SIGNATURE__________________________________  ________________________________________________________________________   Mario Edwards  An incentive spirometer is a tool that can help keep your lungs clear and active. This tool measures how well you are filling your lungs with each breath. Taking long deep breaths may help reverse or decrease the chance of developing breathing (pulmonary) problems (especially infection) following:  A long period of time  when you are unable to move or be active. BEFORE THE PROCEDURE   If the spirometer includes an indicator to show your best effort, your nurse or respiratory therapist will set it to a desired goal.  If possible, sit up straight or lean slightly forward. Try not to slouch.  Hold the incentive spirometer in an upright position. INSTRUCTIONS FOR USE  1. Sit on the edge of your bed if possible, or sit up as far as you can in bed or on a chair. 2. Hold the incentive spirometer in an upright position. 3. Breathe out normally. 4. Place the mouthpiece in your mouth and seal your lips tightly around it. 5. Breathe in slowly and as deeply as possible, raising the piston or the ball toward the top of the column. 6. Hold your breath for 3-5 seconds or for as long as possible. Allow the piston or ball to fall to the bottom of the column. 7. Remove the mouthpiece from your mouth and breathe out normally. 8. Rest for a few seconds and repeat Steps 1 through 7 at least 10 times every 1-2 hours when you are awake. Take your time and take a few normal breaths between deep breaths. 9. The spirometer may include an indicator to show your best effort. Use the indicator as a goal to work toward during each repetition. 10. After each set of 10 deep breaths, practice coughing to be sure your lungs are clear. If you have an incision (the cut made at the time of surgery), support your incision when coughing by placing a pillow or rolled up towels firmly against it. Once you are able to get out of bed, walk around indoors and cough well. You may stop using the incentive spirometer when instructed by your caregiver.  RISKS AND COMPLICATIONS  Take your time so you do not get  dizzy or light-headed.  If you are in pain, you may need to take or ask for pain medication before doing incentive spirometry. It is harder to take a deep breath if you are having pain. AFTER USE  Rest and breathe slowly and easily.  It can be  helpful to keep track of a log of your progress. Your caregiver can provide you with a simple table to help with this. If you are using the spirometer at home, follow these instructions: [place] IF:   You are having difficultly using the spirometer.  You have trouble using the spirometer as often as instructed.  Your pain medication is not giving enough relief while using the spirometer.  You develop fever of 100.5 F (38.1 C) or higher. SEEK IMMEDIATE MEDICAL CARE IF:   You cough up bloody sputum that had not been present before.  You develop fever of 102 F (38.9 C) or greater.  You develop worsening pain at or near the incision site. MAKE SURE YOU:   Understand these instructions.  Will watch your condition.  Will get help right away if you are not doing well or get worse. Document Released: 01/13/2007 Document Revised: 11/25/2011 Document Reviewed: 03/16/2007 ExitCare Patient Information 2014 ExitCare, [place].   ________________________________________________________________________  WHAT IS A BLOOD TRANSFUSION? Blood Transfusion Information  A transfusion is the replacement of blood or some of its parts. Blood is made up of multiple cells which provide different functions.  Red blood cells carry oxygen and are used for blood loss replacement.  White blood cells fight against infection.  Platelets control bleeding.  Plasma helps clot blood.  Other blood products are available for specialized needs, such as hemophilia or other clotting disorders. BEFORE THE TRANSFUSION  Who gives blood for transfusions?   Healthy volunteers who are fully evaluated to make sure their blood is safe. This is blood bank blood. Transfusion therapy is the safest it has ever been in the practice of medicine. Before blood is taken from a donor, a complete history is taken to make sure that person has no history of diseases nor engages in risky social behavior (examples are  intravenous drug use or sexual activity with multiple partners). The donor's travel history is screened to minimize risk of transmitting infections, such as malaria. The donated blood is tested for signs of infectious diseases, such as HIV and hepatitis. The blood is then tested to be sure it is compatible with you in order to minimize the chance of a transfusion reaction. If you or a relative donates blood, this is often done in anticipation of surgery and is not appropriate for emergency situations. It takes many days to process the donated blood. RISKS AND COMPLICATIONS Although transfusion therapy is very safe and saves many lives, the main dangers of transfusion include:   Getting an infectious disease.  Developing a transfusion reaction. This is an allergic reaction to something in the blood you were given. Every precaution is taken to prevent this. The decision to have a blood transfusion has been considered carefully by your caregiver before blood is given. Blood is not given unless the benefits outweigh the risks. AFTER THE TRANSFUSION  Right after receiving a blood transfusion, you will usually feel much better and more energetic. This is especially true if your red blood cells have gotten low (anemic). The transfusion raises the level of the red blood cells which carry oxygen, and this usually causes an energy increase.  The nurse administering the transfusion will  monitor you carefully for complications. HOME CARE INSTRUCTIONS  No special instructions are needed after a transfusion. You may find your energy is better. Speak with your caregiver about any limitations on activity for underlying diseases you may have. SEEK MEDICAL CARE IF:   Your condition is not improving after your transfusion.  You develop redness or irritation at the intravenous (IV) site. SEEK IMMEDIATE MEDICAL CARE IF:  Any of the following symptoms occur over the next 12 hours:  Shaking chills.  You have a  temperature by mouth above 102 F (38.9 C), not controlled by medicine.  Chest, back, or muscle pain.  People around you feel you are not acting correctly or are confused.  Shortness of breath or difficulty breathing.  Dizziness and fainting.  You get a rash or develop hives.  You have a decrease in urine output.  Your urine turns a dark color or changes to pink, red, or brown. Any of the following symptoms occur over the next 10 days:  You have a temperature by mouth above 102 F (38.9 C), not controlled by medicine.  Shortness of breath.  Weakness after normal activity.  The white part of the eye turns yellow (jaundice).  You have a decrease in the amount of urine or are urinating less often.  Your urine turns a dark color or changes to pink, red, or brown. Document Released: 08/30/2000 Document Revised: 11/25/2011 Document Reviewed: 04/18/2008 [facility] Patient Information 2014 [place], [place].  _______________________________________________________________________

## 2017-07-07 NOTE — Progress Notes (Addendum)
05-21-17 (EPIC) LOV with cardiac clearance from Dr. Beverely Paceheek in note.  05-08-17 Surgical clearance on chart

## 2017-07-08 ENCOUNTER — Encounter (HOSPITAL_COMMUNITY): Payer: Self-pay

## 2017-07-08 ENCOUNTER — Encounter (HOSPITAL_COMMUNITY)
Admission: RE | Admit: 2017-07-08 | Discharge: 2017-07-08 | Disposition: A | Discharge status: Home / Self Care | Payer: Medicare Other | Source: Ambulatory Visit | Attending: Orthopedic Surgery | Admitting: Orthopedic Surgery

## 2017-07-08 DIAGNOSIS — M1711 Unilateral primary osteoarthritis, right knee: Secondary | ICD-10-CM | POA: Insufficient documentation

## 2017-07-08 DIAGNOSIS — R9431 Abnormal electrocardiogram [ECG] [EKG]: Secondary | ICD-10-CM | POA: Insufficient documentation

## 2017-07-08 DIAGNOSIS — Z01810 Encounter for preprocedural cardiovascular examination: Secondary | ICD-10-CM | POA: Insufficient documentation

## 2017-07-08 DIAGNOSIS — I10 Essential (primary) hypertension: Secondary | ICD-10-CM | POA: Insufficient documentation

## 2017-07-08 DIAGNOSIS — Z01812 Encounter for preprocedural laboratory examination: Secondary | ICD-10-CM | POA: Diagnosis not present

## 2017-07-08 LAB — COMPREHENSIVE METABOLIC PANEL
ALT: 17 U/L (ref 17–63)
ANION GAP: 10 (ref 5–15)
AST: 24 U/L (ref 15–41)
Albumin: 4.5 g/dL (ref 3.5–5.0)
Alkaline Phosphatase: 69 U/L (ref 38–126)
BUN: 24 mg/dL — AB (ref 6–20)
CHLORIDE: 103 mmol/L (ref 101–111)
CO2: 24 mmol/L (ref 22–32)
Calcium: 9.6 mg/dL (ref 8.9–10.3)
Creatinine, Ser: 1.43 mg/dL — ABNORMAL HIGH (ref 0.61–1.24)
GFR calc Af Amer: 51 mL/min — ABNORMAL LOW (ref >60)
GFR, EST NON AFRICAN AMERICAN: 44 mL/min — AB (ref >60)
Glucose, Bld: 100 mg/dL — ABNORMAL HIGH (ref 65–99)
POTASSIUM: 4.7 mmol/L (ref 3.5–5.1)
Sodium: 137 mmol/L (ref 135–145)
TOTAL PROTEIN: 7.3 g/dL (ref 6.5–8.1)
Total Bilirubin: 0.7 mg/dL (ref 0.3–1.2)

## 2017-07-08 LAB — CBC
HCT: 39.2 % (ref 39.0–52.0)
Hemoglobin: 13.3 g/dL (ref 13.0–17.0)
MCH: 32.1 pg (ref 26.0–34.0)
MCHC: 33.9 g/dL (ref 30.0–36.0)
MCV: 94.7 fL (ref 78.0–100.0)
PLATELETS: 196 10*3/uL (ref 150–400)
RBC: 4.14 MIL/uL — ABNORMAL LOW (ref 4.22–5.81)
RDW: 13.6 % (ref 11.5–15.5)
WBC: 5.7 10*3/uL (ref 4.0–10.5)

## 2017-07-08 LAB — SURGICAL PCR SCREEN
MRSA, PCR: NEGATIVE
STAPHYLOCOCCUS AUREUS: NEGATIVE

## 2017-07-08 LAB — APTT: APTT: 29 s (ref 24–36)

## 2017-07-08 LAB — PROTIME-INR
INR: 0.98
PROTHROMBIN TIME: 12.9 s (ref 11.4–15.2)

## 2017-07-08 NOTE — Progress Notes (Signed)
07-08-17 CMP result routed to Dr. Lequita HaltAluisio via [facility] for review.

## 2017-07-13 ENCOUNTER — Ambulatory Visit: Payer: Self-pay | Admitting: Orthopedic Surgery

## 2017-07-13 NOTE — H&P (Signed)
Mario Edwards DOB: Jan 11, 1932 Married / Language: [place] / Race: White Male Date of admission: July 14, 2017  Chief complaint: Right knee pain History of Present Illness The patient is a 83 year old male who comes in for a preoperative History and Physical. The patient is scheduled for a left total knee arthroplasty to be performed by Dr. Gus Rankin. Aluisio, MD at [facility] on 07-14-2017. The patient is a 83 year old male who presented for follow up of their knee. The patient is being followed for their right knee pain and osteoarthritis. They are now year(s) out from last office visit (09/12/2014). Symptoms reported include: pain, giving way, instability (currently using a cane), pain with weightbearing and difficulty ambulating. The patient feels that they are doing poorly and report their pain level to be moderate. Current treatment includes: bracing. The following medication has been used for pain control: none. The patient has reported symptom improvement with: Cortisone injections while they have not gotten any relief of their symptoms with: viscosupplementation (had Synvisc in 2011). Patient states his left TKA done 07/19/2013 is doing well at this time. The RIGHT knee is now to stage her it is bothering him all the time. He was going to have replaced 3 years ago when he had to care for his ill wife who unfortunately now has passed away. He is ready to go ahead and get it fixed. They have been treated conservatively in the past for the above stated problem and despite conservative measures, they continue to have progressive pain and severe functional limitations and dysfunction. They have failed non-operative management including home exercise, medications, bracing, and injections. It is felt that they would benefit from undergoing total joint replacement. Risks and benefits of the procedure have been discussed with the patient and they elect to proceed with surgery. There are no  active contraindications to surgery such as ongoing infection or rapidly progressive neurological disease.  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 Exercise  Light, daily, 10 - 15 minutes. Exercises daily; does other Number of flights of stairs before winded  1 Tobacco use  Former smoker. former smoker; smoke(d) 3/4 pack(s) per day; uses 1 1/2 can(s) smokeless per week Alcohol use  current drinker; drinks beer, wine and hard liquor; less than 5 per week Marital status  Widowed. Post-Surgical Plans  [facility] at [place]  Medication History  Aspirin (325MG  Tablet, 1 (one) Oral) Active. Toprol XL (Oral) Specific strength unknown - Active. Lipitor (40MG  Tablet, Oral) Active. Ranitidine HCl (Oral) Specific strength unknown - Active. Calcium 600 (Oral) Specific strength unknown - Active. Potassium Supplement Active.  Past Surgical History Vasectomy  Total Hip Replacement  right Appendectomy  Date: 53. Coronary Artery Bypass Graft  4 Knee Replacement, Total [07/19/2013]: Left. Total Knee Replacement - Left    Review of Systems General Not Present-  Chills, Fatigue, Fever, Memory Loss, Night Sweats, Weight Gain and Weight Loss. Skin Present- Psoriasis. Not Present-  Eczema, Hives, Itching, Lesions and Rash. HEENT Present- Double Vision and Vision problems. Not Present- Dentures, Headache, Hearing Loss, Tinnitus and Visual Loss. Respiratory Present- Shortness of breath and Shortness of breath with exertion. Not Present- Allergies, Chronic Cough and Coughing up blood. Cardiovascular Present- Leg Cramps. Not Present- Chest Pain, Difficulty Breathing Lying Down, Murmur, Palpitations, Racing/skipping heartbeats and Swelling. Gastrointestinal Not Present- Abdominal Pain, Bloody Stool, Constipation, Diarrhea, Difficulty Swallowing, Heartburn, Jaundice, Loss of appetitie, Nausea and Vomiting. Male Genitourinary Present- Change in Urinary Stream. Not Present- Blood in Urine, Discharge, Flank Pain, Incontinence, Painful Urination, Urgency, Urinary frequency, Urinary Retention, Urinating at Night and Weak urinary stream. Musculoskeletal Present- Joint Stiffness and Muscle Pain. Not Present- Back Pain, Joint Pain, Joint Swelling, Morning Stiffness, Muscle Weakness and Spasms. Neurological Present- Difficulty with balance. Not Present- Blackout spells, Dizziness, Paralysis, Tremor and Weakness. Psychiatric Not Present- Insomnia. Endocrine Present- Appetite Changes.  Vitals Weight: 180 lb Height: 73in Weight was reported by patient. Height was reported by patient. Body Surface Area: 2.06 m Body Mass Index: 23.75 kg/m  Pulse: 68 (Regular)  BP: 124/78 (Sitting, Right Arm, Standard)    Physical Exam  General Mental Status -Alert, cooperative and good historian. General Appearance-pleasant, Not in acute distress. Orientation-Oriented X3. Build & Nutrition-Well nourished and Well developed.  Head and Neck Head-normocephalic, atraumatic . Neck Global Assessment - supple, no bruit auscultated on the right, no bruit auscultated on the left.  Eye Vision-Wears corrective lenses. Pupil - Bilateral-Regular and Round. Motion -  Bilateral-EOMI.  Chest and Lung Exam Auscultation Breath sounds - clear at anterior chest wall and clear at posterior chest wall. Adventitious sounds - No Adventitious sounds.  Cardiovascular Auscultation Rhythm - Regular rate and rhythm. Heart Sounds - S1 WNL and S2 WNL. Murmurs & Other Heart Sounds - Auscultation of the heart reveals - No Murmurs.  Abdomen Palpation/Percussion Tenderness - Abdomen is non-tender to palpation. Rigidity (guarding) - Abdomen is soft. Auscultation Auscultation of the abdomen reveals - Bowel sounds normal.  Male Genitourinary Note: Not done, not pertinent to present illness   Musculoskeletal Note: He has no pain on range of motion of either hip. His LEFT knee shows a well-healed scar. His range of motion 0-125 no instability. His RIGHT knee shows varus deformity. There is no effusion. Range of motion is 5-125. There is marked crepitus on range of motion. He is tender medial greater than lateral with no instability. Pulses sensation and motor are intact distally. He has a significantly antalgic gait pattern on the RIGHT.  Radiographs-AP and lateral both knees show the prosthesis on the LEFT in excellent position with no periprosthetic abnormalities. On the RIGHT he has bone-on-bone arthritis medial and patellofemoral large osteophyte formation tibial subluxation.   Assessment & Plan Primary osteoarthritis of both knees (M17.0) Status post total knee replacement, left  Note:Surgical Plans: Right Total Knee Replacement  Disposition: Wants to look into [facility] in [place], Lives Alone  [facility] - This patinet is at low cardiac risk for the above planned surgery.  Topical TXA - CAD/CABG  Anesthesia Issues: None  Patient was instructed on what medications to stop prior to surgery.  Signed electronically by Lauraine RinneAlexzandrew L Perkins, III PA-C

## 2017-07-14 ENCOUNTER — Encounter (HOSPITAL_COMMUNITY)
Admission: RE | Disposition: A | Discharge status: Skilled Nursing Facility | Payer: Self-pay | Source: Ambulatory Visit | Attending: Orthopedic Surgery

## 2017-07-14 ENCOUNTER — Encounter (HOSPITAL_COMMUNITY): Payer: Self-pay | Admitting: *Deleted

## 2017-07-14 ENCOUNTER — Inpatient Hospital Stay (HOSPITAL_COMMUNITY): Payer: Medicare Other | Admitting: Anesthesiology

## 2017-07-14 ENCOUNTER — Inpatient Hospital Stay (HOSPITAL_COMMUNITY)
Admission: RE | Admit: 2017-07-14 | Discharge: 2017-07-17 | DRG: 470 | Disposition: A | Discharge status: Skilled Nursing Facility | Payer: Medicare Other | Source: Ambulatory Visit | Attending: Orthopedic Surgery | Admitting: Orthopedic Surgery

## 2017-07-14 DIAGNOSIS — E7800 Pure hypercholesterolemia, unspecified: Secondary | ICD-10-CM | POA: Diagnosis present

## 2017-07-14 DIAGNOSIS — M1711 Unilateral primary osteoarthritis, right knee: Principal | ICD-10-CM | POA: Diagnosis present

## 2017-07-14 DIAGNOSIS — I739 Peripheral vascular disease, unspecified: Secondary | ICD-10-CM | POA: Diagnosis present

## 2017-07-14 DIAGNOSIS — Z7982 Long term (current) use of aspirin: Secondary | ICD-10-CM

## 2017-07-14 DIAGNOSIS — Z96641 Presence of right artificial hip joint: Secondary | ICD-10-CM | POA: Diagnosis present

## 2017-07-14 DIAGNOSIS — M1710 Unilateral primary osteoarthritis, unspecified knee: Secondary | ICD-10-CM | POA: Diagnosis present

## 2017-07-14 DIAGNOSIS — M179 Osteoarthritis of knee, unspecified: Secondary | ICD-10-CM | POA: Diagnosis present

## 2017-07-14 DIAGNOSIS — Z96652 Presence of left artificial knee joint: Secondary | ICD-10-CM | POA: Diagnosis present

## 2017-07-14 DIAGNOSIS — Z951 Presence of aortocoronary bypass graft: Secondary | ICD-10-CM

## 2017-07-14 DIAGNOSIS — I6529 Occlusion and stenosis of unspecified carotid artery: Secondary | ICD-10-CM | POA: Diagnosis present

## 2017-07-14 DIAGNOSIS — I2510 Atherosclerotic heart disease of native coronary artery without angina pectoris: Secondary | ICD-10-CM | POA: Diagnosis present

## 2017-07-14 DIAGNOSIS — K219 Gastro-esophageal reflux disease without esophagitis: Secondary | ICD-10-CM | POA: Diagnosis present

## 2017-07-14 DIAGNOSIS — N289 Disorder of kidney and ureter, unspecified: Secondary | ICD-10-CM | POA: Diagnosis present

## 2017-07-14 DIAGNOSIS — F419 Anxiety disorder, unspecified: Secondary | ICD-10-CM | POA: Diagnosis present

## 2017-07-14 DIAGNOSIS — Z87891 Personal history of nicotine dependence: Secondary | ICD-10-CM | POA: Diagnosis not present

## 2017-07-14 HISTORY — PX: TOTAL KNEE ARTHROPLASTY: SHX125

## 2017-07-14 LAB — TYPE AND SCREEN
ABO/RH(D): O POS
ANTIBODY SCREEN: NEGATIVE

## 2017-07-14 SURGERY — ARTHROPLASTY, KNEE, TOTAL
Anesthesia: Spinal | Site: Knee | Laterality: Right

## 2017-07-14 MED ORDER — CEFAZOLIN SODIUM-DEXTROSE 2-4 GM/100ML-% IV SOLN
INTRAVENOUS | Status: AC
  Filled 2017-07-14: qty 100

## 2017-07-14 MED ORDER — CEFAZOLIN SODIUM-DEXTROSE 2-4 GM/100ML-% IV SOLN
2.0000 g | INTRAVENOUS | Status: AC
  Administered 2017-07-14: 2 g via INTRAVENOUS

## 2017-07-14 MED ORDER — MORPHINE SULFATE (PF) 4 MG/ML IV SOLN: 1.0000 mg | INTRAVENOUS | Status: DC | PRN

## 2017-07-14 MED ORDER — PROPOFOL 500 MG/50ML IV EMUL
INTRAVENOUS | Status: DC | PRN
  Administered 2017-07-14: 75 ug/kg/min via INTRAVENOUS

## 2017-07-14 MED ORDER — METOCLOPRAMIDE HCL 5 MG/ML IJ SOLN: 5.0000 mg | Freq: Three times a day (TID) | INTRAMUSCULAR | Status: DC | PRN

## 2017-07-14 MED ORDER — BUPIVACAINE IN DEXTROSE 0.75-8.25 % IT SOLN
INTRATHECAL | Status: DC | PRN
  Administered 2017-07-14: 1.8 mL via INTRATHECAL

## 2017-07-14 MED ORDER — OXYCODONE HCL 5 MG PO TABS
10.0000 mg | ORAL_TABLET | ORAL | Status: DC | PRN
  Administered 2017-07-16 (×2): 10 mg via ORAL
  Filled 2017-07-14 (×2): qty 2

## 2017-07-14 MED ORDER — PHENYLEPHRINE 40 MCG/ML (10ML) SYRINGE FOR IV PUSH (FOR BLOOD PRESSURE SUPPORT)
PREFILLED_SYRINGE | INTRAVENOUS | Status: DC | PRN
  Administered 2017-07-14: 40 ug via INTRAVENOUS
  Administered 2017-07-14: 80 ug via INTRAVENOUS

## 2017-07-14 MED ORDER — POLYETHYLENE GLYCOL 3350 17 G PO PACK
17.0000 g | PACK | Freq: Every day | ORAL | Status: DC | PRN
Start: 2017-07-14 — End: 2017-07-17
  Administered 2017-07-16 – 2017-07-17 (×2): 17 g via ORAL
  Filled 2017-07-14 (×2): qty 1

## 2017-07-14 MED ORDER — 0.9 % SODIUM CHLORIDE (POUR BTL) OPTIME
TOPICAL | Status: DC | PRN
  Administered 2017-07-14: 1000 mL

## 2017-07-14 MED ORDER — RIVAROXABAN 10 MG PO TABS
10.0000 mg | ORAL_TABLET | Freq: Every day | ORAL | Status: DC
  Administered 2017-07-15 – 2017-07-17 (×3): 10 mg via ORAL
  Filled 2017-07-14 (×3): qty 1

## 2017-07-14 MED ORDER — ACETAMINOPHEN 10 MG/ML IV SOLN
INTRAVENOUS | Status: AC
  Filled 2017-07-14: qty 100

## 2017-07-14 MED ORDER — PROPOFOL 10 MG/ML IV BOLUS
INTRAVENOUS | Status: AC
  Filled 2017-07-14: qty 20

## 2017-07-14 MED ORDER — ONDANSETRON HCL 4 MG/2ML IJ SOLN: 4.0000 mg | Freq: Four times a day (QID) | INTRAMUSCULAR | Status: DC | PRN

## 2017-07-14 MED ORDER — METOCLOPRAMIDE HCL 5 MG PO TABS: 5.0000 mg | ORAL_TABLET | Freq: Three times a day (TID) | ORAL | Status: DC | PRN

## 2017-07-14 MED ORDER — PHENYLEPHRINE 40 MCG/ML (10ML) SYRINGE FOR IV PUSH (FOR BLOOD PRESSURE SUPPORT)
PREFILLED_SYRINGE | INTRAVENOUS | Status: AC
  Filled 2017-07-14: qty 10

## 2017-07-14 MED ORDER — MENTHOL 3 MG MT LOZG: 1.0000 | LOZENGE | OROMUCOSAL | Status: DC | PRN

## 2017-07-14 MED ORDER — FENTANYL CITRATE (PF) 100 MCG/2ML IJ SOLN
50.0000 ug | INTRAMUSCULAR | Status: DC | PRN
  Administered 2017-07-14: 50 ug via INTRAVENOUS

## 2017-07-14 MED ORDER — ONDANSETRON HCL 4 MG PO TABS: 4.0000 mg | ORAL_TABLET | Freq: Four times a day (QID) | ORAL | Status: DC | PRN

## 2017-07-14 MED ORDER — ATORVASTATIN CALCIUM 40 MG PO TABS
40.0000 mg | ORAL_TABLET | Freq: Every morning | ORAL | Status: DC
  Administered 2017-07-15 – 2017-07-17 (×3): 40 mg via ORAL
  Filled 2017-07-14 (×3): qty 1

## 2017-07-14 MED ORDER — SODIUM CHLORIDE 0.9 % IJ SOLN
INTRAMUSCULAR | Status: AC
  Filled 2017-07-14: qty 50

## 2017-07-14 MED ORDER — ACETAMINOPHEN 650 MG RE SUPP: 650.0000 mg | RECTAL | Status: DC | PRN

## 2017-07-14 MED ORDER — DEXAMETHASONE SODIUM PHOSPHATE 10 MG/ML IJ SOLN
10.0000 mg | Freq: Once | INTRAMUSCULAR | Status: AC
  Administered 2017-07-15: 08:00:00 10 mg via INTRAVENOUS
  Filled 2017-07-14: qty 1

## 2017-07-14 MED ORDER — PHENOL 1.4 % MT LIQD
1.0000 | OROMUCOSAL | Status: DC | PRN
  Filled 2017-07-14: qty 177

## 2017-07-14 MED ORDER — BUPIVACAINE LIPOSOME 1.3 % IJ SUSP
INTRAMUSCULAR | Status: DC | PRN
  Administered 2017-07-14 (×2): 10 mL

## 2017-07-14 MED ORDER — DEXAMETHASONE SODIUM PHOSPHATE 10 MG/ML IJ SOLN
10.0000 mg | Freq: Once | INTRAMUSCULAR | Status: AC
  Administered 2017-07-14: 10 mg via INTRAVENOUS

## 2017-07-14 MED ORDER — TRANEXAMIC ACID 1000 MG/10ML IV SOLN
2000.0000 mg | Freq: Once | INTRAVENOUS | Status: DC
  Filled 2017-07-14: qty 20

## 2017-07-14 MED ORDER — LACTATED RINGERS IV SOLN
INTRAVENOUS | Status: DC
  Administered 2017-07-14 (×3): via INTRAVENOUS

## 2017-07-14 MED ORDER — SODIUM CHLORIDE 0.9 % IJ SOLN
INTRAMUSCULAR | Status: DC | PRN
  Administered 2017-07-14 (×2): 30 mL

## 2017-07-14 MED ORDER — STERILE WATER FOR IRRIGATION IR SOLN
Status: DC | PRN
  Administered 2017-07-14: 2000 mL

## 2017-07-14 MED ORDER — ACETAMINOPHEN 500 MG PO TABS
1000.0000 mg | ORAL_TABLET | Freq: Four times a day (QID) | ORAL | Status: AC
  Administered 2017-07-14 – 2017-07-15 (×4): 1000 mg via ORAL
  Filled 2017-07-14 (×4): qty 2

## 2017-07-14 MED ORDER — ONDANSETRON HCL 4 MG/2ML IJ SOLN: 4.0000 mg | Freq: Once | INTRAMUSCULAR | Status: DC | PRN

## 2017-07-14 MED ORDER — CHLORHEXIDINE GLUCONATE 4 % EX LIQD: 60.0000 mL | Freq: Once | CUTANEOUS | Status: DC

## 2017-07-14 MED ORDER — ONDANSETRON HCL 4 MG/2ML IJ SOLN
INTRAMUSCULAR | Status: DC | PRN
  Administered 2017-07-14: 4 mg via INTRAVENOUS

## 2017-07-14 MED ORDER — TRANEXAMIC ACID 1000 MG/10ML IV SOLN
INTRAVENOUS | Status: AC | PRN
  Administered 2017-07-14: 2000 mg via TOPICAL

## 2017-07-14 MED ORDER — CEFAZOLIN SODIUM-DEXTROSE 2-4 GM/100ML-% IV SOLN
2.0000 g | Freq: Four times a day (QID) | INTRAVENOUS | Status: AC
  Administered 2017-07-14 (×2): 2 g via INTRAVENOUS
  Filled 2017-07-14 (×2): qty 100

## 2017-07-14 MED ORDER — METHOCARBAMOL 1000 MG/10ML IJ SOLN
500.0000 mg | Freq: Four times a day (QID) | INTRAVENOUS | Status: DC | PRN
  Filled 2017-07-14: qty 5

## 2017-07-14 MED ORDER — METOPROLOL SUCCINATE ER 50 MG PO TB24
50.0000 mg | ORAL_TABLET | Freq: Every day | ORAL | Status: DC
  Administered 2017-07-14: 50 mg via ORAL
  Filled 2017-07-14: qty 1

## 2017-07-14 MED ORDER — OXYCODONE HCL 5 MG PO TABS
5.0000 mg | ORAL_TABLET | ORAL | Status: DC | PRN
  Administered 2017-07-15 (×2): 5 mg via ORAL
  Filled 2017-07-14 (×2): qty 1

## 2017-07-14 MED ORDER — FENTANYL CITRATE (PF) 100 MCG/2ML IJ SOLN
INTRAMUSCULAR | Status: AC
  Administered 2017-07-14: 50 ug via INTRAVENOUS
  Filled 2017-07-14: qty 2

## 2017-07-14 MED ORDER — FAMOTIDINE 20 MG PO TABS
20.0000 mg | ORAL_TABLET | Freq: Every day | ORAL | Status: DC
Start: 2017-07-15 — End: 2017-07-17
  Administered 2017-07-15 – 2017-07-17 (×3): 20 mg via ORAL
  Filled 2017-07-14 (×3): qty 1

## 2017-07-14 MED ORDER — ROPIVACAINE HCL 7.5 MG/ML IJ SOLN
INTRAMUSCULAR | Status: DC | PRN
  Administered 2017-07-14: 20 mL via PERINEURAL

## 2017-07-14 MED ORDER — ACETAMINOPHEN 10 MG/ML IV SOLN
1000.0000 mg | Freq: Once | INTRAVENOUS | Status: AC
  Administered 2017-07-14: 1000 mg via INTRAVENOUS

## 2017-07-14 MED ORDER — METOPROLOL SUCCINATE ER 50 MG PO TB24
50.0000 mg | ORAL_TABLET | Freq: Every day | ORAL | Status: DC
  Administered 2017-07-15 – 2017-07-17 (×3): 50 mg via ORAL
  Filled 2017-07-14 (×3): qty 1

## 2017-07-14 MED ORDER — BUPIVACAINE LIPOSOME 1.3 % IJ SUSP
20.0000 mL | Freq: Once | INTRAMUSCULAR | Status: DC
  Filled 2017-07-14: qty 20

## 2017-07-14 MED ORDER — DIPHENHYDRAMINE HCL 12.5 MG/5ML PO ELIX
12.5000 mg | ORAL_SOLUTION | ORAL | Status: DC | PRN
  Administered 2017-07-15: 25 mg via ORAL
  Filled 2017-07-14: qty 10

## 2017-07-14 MED ORDER — SODIUM CHLORIDE 0.9 % IV SOLN
INTRAVENOUS | Status: DC
  Administered 2017-07-14 – 2017-07-15 (×2): via INTRAVENOUS

## 2017-07-14 MED ORDER — ACETAMINOPHEN 325 MG PO TABS
650.0000 mg | ORAL_TABLET | ORAL | Status: DC | PRN
  Administered 2017-07-15 – 2017-07-17 (×3): 650 mg via ORAL
  Filled 2017-07-14 (×4): qty 2

## 2017-07-14 MED ORDER — SODIUM CHLORIDE 0.9 % IJ SOLN
INTRAMUSCULAR | Status: AC
  Filled 2017-07-14: qty 10

## 2017-07-14 MED ORDER — BISACODYL 10 MG RE SUPP: 10.0000 mg | Freq: Every day | RECTAL | Status: DC | PRN

## 2017-07-14 MED ORDER — FLEET ENEMA 7-19 GM/118ML RE ENEM: 1.0000 | ENEMA | Freq: Once | RECTAL | Status: DC | PRN

## 2017-07-14 MED ORDER — METHOCARBAMOL 500 MG PO TABS
500.0000 mg | ORAL_TABLET | Freq: Four times a day (QID) | ORAL | Status: DC | PRN
  Administered 2017-07-15 – 2017-07-17 (×7): 500 mg via ORAL
  Filled 2017-07-14 (×8): qty 1

## 2017-07-14 MED ORDER — HYDROMORPHONE HCL 1 MG/ML IJ SOLN: 0.2500 mg | INTRAMUSCULAR | Status: DC | PRN

## 2017-07-14 MED ORDER — DOCUSATE SODIUM 100 MG PO CAPS
100.0000 mg | ORAL_CAPSULE | Freq: Two times a day (BID) | ORAL | Status: DC
  Administered 2017-07-14 – 2017-07-17 (×6): 100 mg via ORAL
  Filled 2017-07-14 (×6): qty 1

## 2017-07-14 MED ORDER — TRAMADOL HCL 50 MG PO TABS
50.0000 mg | ORAL_TABLET | Freq: Four times a day (QID) | ORAL | Status: DC | PRN
  Filled 2017-07-14: qty 1

## 2017-07-14 SURGICAL SUPPLY — 49 items
BAG DECANTER FOR FLEXI CONT (MISCELLANEOUS) ×3 IMPLANT
BAG ZIPLOCK 12X15 (MISCELLANEOUS) ×3 IMPLANT
BANDAGE ACE 6X5 VEL STRL LF (GAUZE/BANDAGES/DRESSINGS) ×3 IMPLANT
BLADE SAG 18X100X1.27 (BLADE) ×3 IMPLANT
BLADE SAW SGTL 11.0X1.19X90.0M (BLADE) ×3 IMPLANT
BOWL SMART MIX CTS (DISPOSABLE) ×3 IMPLANT
CAP KNEE TOTAL 3 SIGMA ×3 IMPLANT
CEMENT HV SMART SET (Cement) ×6 IMPLANT
CLOSURE WOUND 1/2 X4 (GAUZE/BANDAGES/DRESSINGS) ×2
COVER SURGICAL LIGHT HANDLE (MISCELLANEOUS) ×3 IMPLANT
CUFF TOURN SGL QUICK 34 (TOURNIQUET CUFF) ×2
CUFF TRNQT CYL 34X4X40X1 (TOURNIQUET CUFF) ×1 IMPLANT
DECANTER SPIKE VIAL GLASS SM (MISCELLANEOUS) ×3 IMPLANT
DRAPE U-SHAPE 47X51 STRL (DRAPES) ×3 IMPLANT
DRSG ADAPTIC 3X8 NADH LF (GAUZE/BANDAGES/DRESSINGS) ×3 IMPLANT
DRSG PAD ABDOMINAL 8X10 ST (GAUZE/BANDAGES/DRESSINGS) ×3 IMPLANT
DURAPREP 26ML APPLICATOR (WOUND CARE) ×3 IMPLANT
ELECT REM PT RETURN 15FT ADLT (MISCELLANEOUS) ×3 IMPLANT
EVACUATOR 1/8 PVC DRAIN (DRAIN) ×3 IMPLANT
GAUZE SPONGE 4X4 12PLY STRL (GAUZE/BANDAGES/DRESSINGS) ×3 IMPLANT
GLOVE BIO SURGEON STRL SZ7.5 (GLOVE) ×3 IMPLANT
GLOVE BIO SURGEON STRL SZ8 (GLOVE) ×3 IMPLANT
GLOVE BIOGEL PI IND STRL 6.5 (GLOVE) IMPLANT
GLOVE BIOGEL PI IND STRL 8 (GLOVE) ×2 IMPLANT
GLOVE BIOGEL PI INDICATOR 6.5 (GLOVE)
GLOVE BIOGEL PI INDICATOR 8 (GLOVE) ×4
GLOVE SURG SS PI 6.5 STRL IVOR (GLOVE) IMPLANT
GOWN STRL REUS W/TWL LRG LVL3 (GOWN DISPOSABLE) ×3 IMPLANT
GOWN STRL REUS W/TWL XL LVL3 (GOWN DISPOSABLE) ×3 IMPLANT
HANDPIECE INTERPULSE COAX TIP (DISPOSABLE) ×2
IMMOBILIZER KNEE 20 (SOFTGOODS) ×3
IMMOBILIZER KNEE 20 THIGH 36 (SOFTGOODS) ×1 IMPLANT
MANIFOLD NEPTUNE II (INSTRUMENTS) ×3 IMPLANT
NS IRRIG 1000ML POUR BTL (IV SOLUTION) ×3 IMPLANT
PACK TOTAL KNEE CUSTOM (KITS) ×3 IMPLANT
PADDING CAST COTTON 6X4 STRL (CAST SUPPLIES) ×6 IMPLANT
POSITIONER SURGICAL ARM (MISCELLANEOUS) ×3 IMPLANT
SET HNDPC FAN SPRY TIP SCT (DISPOSABLE) ×1 IMPLANT
STRIP CLOSURE SKIN 1/2X4 (GAUZE/BANDAGES/DRESSINGS) ×4 IMPLANT
SUT MNCRL AB 4-0 PS2 18 (SUTURE) ×3 IMPLANT
SUT STRATAFIX 0 PDS 27 VIOLET (SUTURE) ×3
SUT VIC AB 2-0 CT1 27 (SUTURE) ×6
SUT VIC AB 2-0 CT1 TAPERPNT 27 (SUTURE) ×3 IMPLANT
SUTURE STRATFX 0 PDS 27 VIOLET (SUTURE) ×1 IMPLANT
SYR 30ML LL (SYRINGE) ×6 IMPLANT
TRAY FOLEY W/METER SILVER 16FR (SET/KITS/TRAYS/PACK) ×3 IMPLANT
WATER STERILE IRR 1000ML POUR (IV SOLUTION) ×6 IMPLANT
WRAP KNEE MAXI GEL POST OP (GAUZE/BANDAGES/DRESSINGS) ×3 IMPLANT
YANKAUER SUCT BULB TIP 10FT TU (MISCELLANEOUS) ×3 IMPLANT

## 2017-07-14 NOTE — H&P (View-Only) (Signed)
Franchot Heidelberg DOB: Jan 11, 1932 Married / Language: [place] / Race: White Male Date of admission: July 14, 2017  Chief complaint: Right knee pain History of Present Illness The patient is a 83 year old male who comes in for a preoperative History and Physical. The patient is scheduled for a left total knee arthroplasty to be performed by Dr. Gus Rankin. Aluisio, MD at [facility] on 07-14-2017. The patient is a 83 year old male who presented for follow up of their knee. The patient is being followed for their right knee pain and osteoarthritis. They are now year(s) out from last office visit (09/12/2014). Symptoms reported include: pain, giving way, instability (currently using a cane), pain with weightbearing and difficulty ambulating. The patient feels that they are doing poorly and report their pain level to be moderate. Current treatment includes: bracing. The following medication has been used for pain control: none. The patient has reported symptom improvement with: Cortisone injections while they have not gotten any relief of their symptoms with: viscosupplementation (had Synvisc in 2011). Patient states his left TKA done 07/19/2013 is doing well at this time. The RIGHT knee is now to stage her it is bothering him all the time. He was going to have replaced 3 years ago when he had to care for his ill wife who unfortunately now has passed away. He is ready to go ahead and get it fixed. They have been treated conservatively in the past for the above stated problem and despite conservative measures, they continue to have progressive pain and severe functional limitations and dysfunction. They have failed non-operative management including home exercise, medications, bracing, and injections. It is felt that they would benefit from undergoing total joint replacement. Risks and benefits of the procedure have been discussed with the patient and they elect to proceed with surgery. There are no  active contraindications to surgery such as ongoing infection or rapidly progressive neurological disease.  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 Exercise  Light, daily, 10 - 15 minutes. Exercises daily; does other Number of flights of stairs before winded  1 Tobacco use  Former smoker. former smoker; smoke(d) 3/4 pack(s) per day; uses 1 1/2 can(s) smokeless per week Alcohol use  current drinker; drinks beer, wine and hard liquor; less than 5 per week Marital status  Widowed. Post-Surgical Plans  [facility] at [place]  Medication History  Aspirin (325MG  Tablet, 1 (one) Oral) Active. Toprol XL (Oral) Specific strength unknown - Active. Lipitor (40MG  Tablet, Oral) Active. Ranitidine HCl (Oral) Specific strength unknown - Active. Calcium 600 (Oral) Specific strength unknown - Active. Potassium Supplement Active.  Past Surgical History Vasectomy  Total Hip Replacement  right Appendectomy  Date: 53. Coronary Artery Bypass Graft  4 Knee Replacement, Total [07/19/2013]: Left. Total Knee Replacement - Left    Review of Systems General Not Present-  Chills, Fatigue, Fever, Memory Loss, Night Sweats, Weight Gain and Weight Loss. Skin Present- Psoriasis. Not Present-  Eczema, Hives, Itching, Lesions and Rash. HEENT Present- Double Vision and Vision problems. Not Present- Dentures, Headache, Hearing Loss, Tinnitus and Visual Loss. Respiratory Present- Shortness of breath and Shortness of breath with exertion. Not Present- Allergies, Chronic Cough and Coughing up blood. Cardiovascular Present- Leg Cramps. Not Present- Chest Pain, Difficulty Breathing Lying Down, Murmur, Palpitations, Racing/skipping heartbeats and Swelling. Gastrointestinal Not Present- Abdominal Pain, Bloody Stool, Constipation, Diarrhea, Difficulty Swallowing, Heartburn, Jaundice, Loss of appetitie, Nausea and Vomiting. Male Genitourinary Present- Change in Urinary Stream. Not Present- Blood in Urine, Discharge, Flank Pain, Incontinence, Painful Urination, Urgency, Urinary frequency, Urinary Retention, Urinating at Night and Weak urinary stream. Musculoskeletal Present- Joint Stiffness and Muscle Pain. Not Present- Back Pain, Joint Pain, Joint Swelling, Morning Stiffness, Muscle Weakness and Spasms. Neurological Present- Difficulty with balance. Not Present- Blackout spells, Dizziness, Paralysis, Tremor and Weakness. Psychiatric Not Present- Insomnia. Endocrine Present- Appetite Changes.  Vitals Weight: 180 lb Height: 73in Weight was reported by patient. Height was reported by patient. Body Surface Area: 2.06 m Body Mass Index: 23.75 kg/m  Pulse: 68 (Regular)  BP: 124/78 (Sitting, Right Arm, Standard)    Physical Exam  General Mental Status -Alert, cooperative and good historian. General Appearance-pleasant, Not in acute distress. Orientation-Oriented X3. Build & Nutrition-Well nourished and Well developed.  Head and Neck Head-normocephalic, atraumatic . Neck Global Assessment - supple, no bruit auscultated on the right, no bruit auscultated on the left.  Eye Vision-Wears corrective lenses. Pupil - Bilateral-Regular and Round. Motion -  Bilateral-EOMI.  Chest and Lung Exam Auscultation Breath sounds - clear at anterior chest wall and clear at posterior chest wall. Adventitious sounds - No Adventitious sounds.  Cardiovascular Auscultation Rhythm - Regular rate and rhythm. Heart Sounds - S1 WNL and S2 WNL. Murmurs & Other Heart Sounds - Auscultation of the heart reveals - No Murmurs.  Abdomen Palpation/Percussion Tenderness - Abdomen is non-tender to palpation. Rigidity (guarding) - Abdomen is soft. Auscultation Auscultation of the abdomen reveals - Bowel sounds normal.  Male Genitourinary Note: Not done, not pertinent to present illness   Musculoskeletal Note: He has no pain on range of motion of either hip. His LEFT knee shows a well-healed scar. His range of motion 0-125 no instability. His RIGHT knee shows varus deformity. There is no effusion. Range of motion is 5-125. There is marked crepitus on range of motion. He is tender medial greater than lateral with no instability. Pulses sensation and motor are intact distally. He has a significantly antalgic gait pattern on the RIGHT.  Radiographs-AP and lateral both knees show the prosthesis on the LEFT in excellent position with no periprosthetic abnormalities. On the RIGHT he has bone-on-bone arthritis medial and patellofemoral large osteophyte formation tibial subluxation.   Assessment & Plan Primary osteoarthritis of both knees (M17.0) Status post total knee replacement, left  Note:Surgical Plans: Right Total Knee Replacement  Disposition: Wants to look into [facility] in [place], Lives Alone  [facility] - This patinet is at low cardiac risk for the above planned surgery.  Topical TXA - CAD/CABG  Anesthesia Issues: None  Patient was instructed on what medications to stop prior to surgery.  Signed electronically by Lauraine RinneAlexzandrew L Tannah Dreyfuss, III PA-C

## 2017-07-14 NOTE — Discharge Instructions (Addendum)
° °Dr. Frank Aluisio °Total Joint Specialist °[facility] °[address] °[place], [place] [address] °[phone] ° °TOTAL KNEE REPLACEMENT POSTOPERATIVE DIRECTIONS ° °Knee Rehabilitation, Guidelines Following Surgery  °Results after knee surgery are often greatly improved when you follow the exercise, range of motion and muscle strengthening exercises prescribed by your doctor. Safety measures are also important to protect the knee from further injury. Any time any of these exercises cause you to have increased pain or swelling in your knee joint, decrease the amount until you are comfortable again and slowly increase them. If you have problems or questions, call your caregiver or physical therapist for advice.  ° °HOME CARE INSTRUCTIONS  °Remove items at home which could result in a fall. This includes throw rugs or furniture in walking pathways.  °· ICE to the affected knee every three hours for 30 minutes at a time and then as needed for pain and swelling.  Continue to use ice on the knee for pain and swelling from surgery. You may notice swelling that will progress down to the foot and ankle.  This is normal after surgery.  Elevate the leg when you are not up walking on it.   °· Continue to use the breathing machine which will help keep your temperature down.  It is common for your temperature to cycle up and down following surgery, especially at night when you are not up moving around and exerting yourself.  The breathing machine keeps your lungs expanded and your temperature down. °· Do not place pillow under knee, focus on keeping the knee straight while resting ° °DIET °You may resume your previous home diet once your are discharged from the hospital. ° °DRESSING / WOUND CARE / SHOWERING °You may shower 3 days after surgery, but keep the wounds dry during showering.  You may use an occlusive plastic wrap (Press'n Seal for example), NO SOAKING/SUBMERGING IN THE BATHTUB.  If the  bandage gets wet, change with a clean dry gauze.  If the incision gets wet, pat the wound dry with a clean towel. °You may start showering once you are discharged home but do not submerge the incision under water. Just pat the incision dry and apply a dry gauze dressing on daily. °Change the surgical dressing daily and reapply a dry dressing each time. ° °ACTIVITY °Walk with your walker as instructed. °Use walker as long as suggested by your caregivers. °Avoid periods of inactivity such as sitting longer than an hour when not asleep. This helps prevent blood clots.  °You may resume a sexual relationship in one month or when given the OK by your doctor.  °You may return to work once you are cleared by your doctor.  °Do not drive a car for 6 weeks or until released by you surgeon.  °Do not drive while taking narcotics. ° °WEIGHT BEARING °Weight bearing as tolerated with assist device (walker, cane, etc) as directed, use it as long as suggested by your surgeon or therapist, typically at least 4-6 weeks. ° °POSTOPERATIVE CONSTIPATION PROTOCOL °Constipation - defined medically as fewer than three stools per week and severe constipation as less than one stool per week. ° °One of the most common issues patients have following surgery is constipation.  Even if you have a regular bowel pattern at home, your normal regimen is likely to be disrupted due to multiple reasons following surgery.  Combination of anesthesia, postoperative narcotics, change in appetite and fluid intake all can affect your bowels.    In order to avoid complications following surgery, here are some recommendations in order to help you during your recovery period. ° °Colace (docusate) - Pick up an over-the-counter form of Colace or another stool softener and take twice a day as long as you are requiring postoperative pain medications.  Take with a full glass of water daily.  If you experience loose stools or diarrhea, hold the colace until you stool forms  back up.  If your symptoms do not get better within 1 week or if they get worse, check with your doctor. ° °Dulcolax (bisacodyl) - Pick up over-the-counter and take as directed by the product packaging as needed to assist with the movement of your bowels.  Take with a full glass of water.  Use this product as needed if not relieved by Colace only.  ° °MiraLax (polyethylene glycol) - Pick up over-the-counter to have on hand.  MiraLax is a solution that will increase the amount of water in your bowels to assist with bowel movements.  Take as directed and can mix with a glass of water, juice, soda, coffee, or tea.  Take if you go more than two days without a movement. °Do not use MiraLax more than once per day. Call your doctor if you are still constipated or irregular after using this medication for 7 days in a row. ° °If you continue to have problems with postoperative constipation, please contact the office for further assistance and recommendations.  If you experience "the worst abdominal pain ever" or develop nausea or vomiting, please contact the office immediatly for further recommendations for treatment. ° °ITCHING ° If you experience itching with your medications, try taking only a single pain pill, or even half a pain pill at a time.  You can also use Benadryl over the counter for itching or also to help with sleep.  ° °TED HOSE STOCKINGS °Wear the elastic stockings on both legs for three weeks following surgery during the day but you may remove then at night for sleeping. ° °MEDICATIONS °See your medication summary on the “After Visit Summary” that the nursing staff will review with you prior to discharge.  You may have some home medications which will be placed on hold until you complete the course of blood thinner medication.  It is important for you to complete the blood thinner medication as prescribed by your surgeon.  Continue your approved medications as instructed at time of  discharge. ° °PRECAUTIONS °If you experience chest pain or shortness of breath - call 911 immediately for transfer to the hospital emergency department.  °If you develop a fever greater that 101 F, purulent drainage from wound, increased redness or drainage from wound, foul odor from the wound/dressing, or calf pain - CONTACT YOUR SURGEON.   °                                                °FOLLOW-UP APPOINTMENTS °Make sure you keep all of your appointments after your operation with your surgeon and caregivers. You should call the office at the above phone number and make an appointment for approximately two weeks after the date of your surgery or on the date instructed by your surgeon outlined in the "After Visit Summary". ° ° °RANGE OF MOTION AND STRENGTHENING EXERCISES  °Rehabilitation of the knee is important following a knee injury or   an operation. After just a few days of immobilization, the muscles of the thigh which control the knee become weakened and shrink (atrophy). Knee exercises are designed to build up the tone and strength of the thigh muscles and to improve knee motion. Often times heat used for twenty to thirty minutes before working out will loosen up your tissues and help with improving the range of motion but do not use heat for the first two weeks following surgery. These exercises can be done on a training (exercise) mat, on the floor, on a table or on a bed. Use what ever works the best and is most comfortable for you Knee exercises include:  Leg Lifts - While your knee is still immobilized in a splint or cast, you can do straight leg raises. Lift the leg to 60 degrees, hold for 3 sec, and slowly lower the leg. Repeat 10-20 times 2-3 times daily. Perform this exercise against resistance later as your knee gets better.  Quad and Hamstring Sets - Tighten up the muscle on the front of the thigh (Quad) and hold for 5-10 sec. Repeat this 10-20 times hourly. Hamstring sets are done by pushing the  foot backward against an object and holding for 5-10 sec. Repeat as with quad sets.   Leg Slides: Lying on your back, slowly slide your foot toward your buttocks, bending your knee up off the floor (only go as far as is comfortable). Then slowly slide your foot back down until your leg is flat on the floor again.  Angel Wings: Lying on your back spread your legs to the side as far apart as you can without causing discomfort.  A rehabilitation program following serious knee injuries can speed recovery and prevent re-injury in the future due to weakened muscles. Contact your doctor or a physical therapist for more information on knee rehabilitation.   IF YOU ARE TRANSFERRED TO A SKILLED REHAB FACILITY If the patient is transferred to a skilled rehab facility following release from the hospital, a list of the current medications will be sent to the facility for the patient to continue.  When discharged from the skilled rehab facility, please have the facility set up the patient's [place] prior to being released. Also, the skilled facility will be responsible for providing the patient with their medications at time of release from the facility to include their pain medication, the muscle relaxants, and their blood thinner medication. If the patient is still at the rehab facility at time of the two week follow up appointment, the skilled rehab facility will also need to assist the patient in arranging follow up appointment in our office and any transportation needs.  MAKE SURE YOU:  Understand these instructions.  Get help right away if you are not doing well or get worse.    Pick up stool softner and laxative for home use following surgery while on pain medications. Do not submerge incision under water. Please use good hand washing techniques while changing dressing each day. May shower starting three days after surgery. Please use a clean towel to pat the incision dry following  showers. Continue to use ice for pain and swelling after surgery. Do not use any lotions or creams on the incision until instructed by your surgeon.  Take Xarelto for two and a half more weeks following discharge from the hospital, then discontinue Xarelto. Once the patient has completed the Xarelto, they may resume the 325 mg Aspirin.    Information on  my medicine - XARELTO (Rivaroxaban)  This medication education was reviewed with me or my healthcare representative as part of my discharge preparation. Why was Xarelto prescribed for you? Xarelto was prescribed for you to reduce the risk of blood clots forming after orthopedic surgery. The medical term for these abnormal blood clots is venous thromboembolism (VTE).  What do you need to know about xarelto ? Take your Xarelto ONCE DAILY at the same time every day. You may take it either with or without food.  If you have difficulty swallowing the tablet whole, you may crush it and mix in applesauce just prior to taking your dose.  Take Xarelto exactly as prescribed by your doctor and DO NOT stop taking Xarelto without talking to the doctor who prescribed the medication.  Stopping without other VTE prevention medication to take the place of Xarelto may increase your risk of developing a clot.  After discharge, you should have regular check-up appointments with your healthcare provider that is prescribing your Xarelto.    What do you do if you miss a dose? If you miss a dose, take it as soon as you remember on the same day then continue your regularly scheduled once daily regimen the next day. Do not take two doses of Xarelto on the same day.   Important Safety Information A possible side effect of Xarelto is bleeding. You should call your healthcare provider right away if you experience any of the following: ? Bleeding from an injury or your nose that does not stop. ? Unusual colored urine (red or dark brown) or unusual colored  stools (red or black). ? Unusual bruising for unknown reasons. ? A serious fall or if you hit your head (even if there is no bleeding).  Some medicines may interact with Xarelto and might increase your risk of bleeding while on Xarelto. To help avoid this, consult your healthcare provider or pharmacist prior to using any new prescription or non-prescription medications, including herbals, vitamins, non-steroidal anti-inflammatory drugs (NSAIDs) and supplements.  This website has more information on Xarelto: [email or web address].[email or web address].

## 2017-07-14 NOTE — Anesthesia Postprocedure Evaluation (Signed)
Anesthesia Post Note  Patient: Mario Edwards  Procedure(s) Performed: RIGHT TOTAL KNEE ARTHROPLASTY (Right Knee)     Patient location during evaluation: PACU Anesthesia Type: Spinal Level of consciousness: awake and alert Pain management: pain level controlled Vital Signs Assessment: post-procedure vital signs reviewed and stable Respiratory status: spontaneous breathing and respiratory function stable Cardiovascular status: blood pressure returned to baseline and stable Postop Assessment: spinal receding Anesthetic complications: no    Last Vitals:  Vitals:   07/14/17 1245 07/14/17 1250  BP: (!) 168/69 (!) 168/69  Pulse: (!) 57 (!) 57  Resp: 14 14  Temp: 36.4 C 36.4 C  SpO2: 100% 100%    Last Pain:  Vitals:   07/14/17 0815  TempSrc: Oral                 Kennieth RadFitzgerald, Jeoffrey Eleazer E

## 2017-07-14 NOTE — Interval H&P Note (Signed)
History and Physical Interval Note:  07/14/2017 8:08 AM  Mario Edwards  has presented today for surgery, with the diagnosis of Osteoarthritis Right Knee  The various methods of treatment have been discussed with the patient and family. After consideration of risks, benefits and other options for treatment, the patient has consented to  Procedure(s): RIGHT TOTAL KNEE ARTHROPLASTY (Right) as a surgical intervention .  The patient's history has been reviewed, patient examined, no change in status, stable for surgery.  I have reviewed the patient's chart and labs.  Questions were answered to the patient's satisfaction.     Loanne DrillingALUISIO,Tilford Deaton V

## 2017-07-14 NOTE — Progress Notes (Signed)
AssistedDr. Rob Fitzgerald with right, ultrasound guided, adductor canal block. Side rails up, monitors on throughout procedure. See vital signs in flow sheet. Tolerated Procedure well.  

## 2017-07-14 NOTE — Anesthesia Procedure Notes (Signed)
Anesthesia Regional Block: Adductor canal block   Pre-Anesthetic Checklist: ,, timeout performed, Correct Patient, Correct Site, Correct Laterality, Correct Procedure, Correct Position, site marked, Risks and benefits discussed,  Surgical consent,  Pre-op evaluation,  At surgeon's request and post-op pain management  Laterality: Right  Prep: chloraprep       Needles:  Injection technique: Single-shot  Needle Type: Echogenic Needle     Needle Length: 9cm  Needle Gauge: 21     Additional Needles:   Procedures:,,,, ultrasound used (permanent image in chart),,,,  Narrative:  Start time: 07/14/2017 9:20 AM End time: 07/14/2017 9:25 AM Injection made incrementally with aspirations every 5 mL.  Performed by: Personally  Anesthesiologist: Marcene DuosFITZGERALD, Darlene Brozowski

## 2017-07-14 NOTE — Op Note (Signed)
OPERATIVE REPORT-TOTAL KNEE ARTHROPLASTY   Pre-operative diagnosis- Osteoarthritis  Right knee(s)  Post-operative diagnosis- Osteoarthritis Right knee(s)  Procedure-  Right  Total Knee Arthroplasty  Surgeon- Mario Rankin. Chantrice Hagg, MD  Assistant- Avel Peace, PA-C   Anesthesia-  Adductor canal block and spinal  EBL-50 mL   Drains Hemovac  Tourniquet time-  Total Tourniquet Time Documented: Thigh (Right) - 37 minutes Total: Thigh (Right) - 37 minutes     Complications- None  Condition-PACU - hemodynamically stable.   Brief Clinical Note  Mario Edwards is a 83 y.o. year old male with end stage OA of his right knee with progressively worsening pain and dysfunction. He has constant pain, with activity and at rest and significant functional deficits with difficulties even with ADLs. He has had extensive non-op management including analgesics, injections of cortisone and viscosupplements, and home exercise program, but remains in significant pain with significant dysfunction. Radiographs show bone on bone arthritis bone on bone medial and patellofemoral with varus deformity. He presents now for right Total Knee Arthroplasty.    Procedure in detail---   The patient is brought into the operating room and positioned supine on the operating table. After successful administration of  Adductor canal block and spinal,   a tourniquet is placed high on the  Right thigh(s) and the lower extremity is prepped and draped in the usual sterile fashion. Time out is performed by the operating team and then the  Right lower extremity is wrapped in Esmarch, knee flexed and the tourniquet inflated to 300 mmHg.       A midline incision is made with a ten blade through the subcutaneous tissue to the level of the extensor mechanism. A fresh blade is used to make a medial parapatellar arthrotomy. Soft tissue over the proximal medial tibia is subperiosteally elevated to the joint line with a knife and into the  semimembranosus bursa with a Cobb elevator. Soft tissue over the proximal lateral tibia is elevated with attention being paid to avoiding the patellar tendon on the tibial tubercle. The patella is everted, knee flexed 90 degrees and the ACL and PCL are removed. Findings are bone on bone medial and patellofemoral with large global osteophytes.        The drill is used to create a starting hole in the distal femur and the canal is thoroughly irrigated with sterile saline to remove the fatty contents. The 5 degree Right  valgus alignment guide is placed into the femoral canal and the distal femoral cutting block is pinned to remove 10 mm off the distal femur. Resection is made with an oscillating saw.      The tibia is subluxed forward and the menisci are removed. The extramedullary alignment guide is placed referencing proximally at the medial aspect of the tibial tubercle and distally along the second metatarsal axis and tibial crest. The block is pinned to remove 2mm off the more deficient medial   side. Resection is made with an oscillating saw. Size 5is the most appropriate size for the tibia and the proximal tibia is prepared with the modular drill and keel punch for that size.      The femoral sizing guide is placed and size 5 is most appropriate. Rotation is marked off the epicondylar axis and confirmed by creating a rectangular flexion gap at 90 degrees. The size 5 cutting block is pinned in this rotation and the anterior, posterior and chamfer cuts are made with the oscillating saw. The intercondylar block is then  placed and that cut is made.      Trial size 5 tibial component, trial size 5 posterior stabilized femur and a 15  mm posterior stabilized rotating platform insert trial is placed. Full extension is achieved with excellent varus/valgus and anterior/posterior balance throughout full range of motion. The patella is everted and thickness measured to be 24  mm. Free hand resection is taken to 14 mm,  a 38 template is placed, lug holes are drilled, trial patella is placed, and it tracks normally. Osteophytes are removed off the posterior femur with the trial in place. All trials are removed and the cut bone surfaces prepared with pulsatile lavage. Cement is mixed and once ready for implantation, the size 5 tibial implant, size  5 posterior stabilized femoral component, and the size 38 patella are cemented in place and the patella is held with the clamp. The trial insert is placed and the knee held in full extension. The Exparel (20 ml mixed with 60 ml saline) is injected into the extensor mechanism, posterior capsule, medial and lateral gutters and subcutaneous tissues.  All extruded cement is removed and once the cement is hard the permanent 15 mm posterior stabilized rotating platform insert is placed into the tibial tray.      The wound is copiously irrigated with saline solution and the extensor mechanism closed over a hemovac drain with #1 V-loc suture. The tourniquet is released for a total tourniquet time of 37  minutes. Flexion against gravity is 140 degrees and the patella tracks normally. Subcutaneous tissue is closed with 2.0 vicryl and subcuticular with running 4.0 Monocryl. The incision is cleaned and dried and steri-strips and a bulky sterile dressing are applied. The limb is placed into a knee immobilizer and the patient is awakened and transported to recovery in stable condition.      Please note that a surgical assistant was a medical necessity for this procedure in order to perform it in a safe and expeditious manner. Surgical assistant was necessary to retract the ligaments and vital neurovascular structures to prevent injury to them and also necessary for proper positioning of the limb to allow for anatomic placement of the prosthesis.   Mario RankinFrank V. Aadil Sur, MD    07/14/2017, 11:19 AM

## 2017-07-14 NOTE — Anesthesia Preprocedure Evaluation (Addendum)
Anesthesia Evaluation  Patient identified by MRN, date of birth, ID band Patient awake    Reviewed: Allergy & Precautions, NPO status , Patient's Chart, lab work & pertinent test results, reviewed documented beta blocker date and time   Airway Mallampati: II  TM Distance: >3 FB Neck ROM: Full    Dental  (+) Dental Advisory Given   Pulmonary former smoker,    breath sounds clear to auscultation       Cardiovascular hypertension, Pt. on medications and Pt. on home beta blockers + CAD, + CABG and + Peripheral Vascular Disease   Rhythm:Regular Rate:Normal     Neuro/Psych negative neurological ROS     GI/Hepatic Neg liver ROS, GERD  ,  Endo/Other  negative endocrine ROS  Renal/GU Renal InsufficiencyRenal disease     Musculoskeletal  (+) Arthritis ,   Abdominal   Peds  Hematology negative hematology ROS (+)   Anesthesia Other Findings   Reproductive/Obstetrics                             Lab Results  Component Value Date   WBC 5.7 07/08/2017   HGB 13.3 07/08/2017   HCT 39.2 07/08/2017   MCV 94.7 07/08/2017   PLT 196 07/08/2017   Lab Results  Component Value Date   CREATININE 1.43 (H) 07/08/2017   BUN 24 (H) 07/08/2017   NA 137 07/08/2017   K 4.7 07/08/2017   CL 103 07/08/2017   CO2 24 07/08/2017   Lab Results  Component Value Date   INR 0.98 07/08/2017   INR 0.99 07/14/2013   INR 2.17 (H) 07/02/2010    Anesthesia Physical Anesthesia Plan  ASA: III  Anesthesia Plan: Spinal   Post-op Pain Management:  Regional for Post-op pain   Induction: Intravenous  PONV Risk Score and Plan: 2 and Ondansetron, Dexamethasone, Propofol infusion and Treatment may vary due to age or medical condition  Airway Management Planned: Natural Airway and Simple Face Mask  Additional Equipment: None  Intra-op Plan:   Post-operative Plan:   Informed Consent: I have reviewed the patients  History and Physical, chart, labs and discussed the procedure including the risks, benefits and alternatives for the proposed anesthesia with the patient or authorized representative who has indicated his/her understanding and acceptance.     Plan Discussed with: CRNA  Anesthesia Plan Comments:        Anesthesia Quick Evaluation

## 2017-07-14 NOTE — Anesthesia Procedure Notes (Signed)
Spinal  Start time: 07/14/2017 10:16 AM End time: 07/14/2017 10:21 AM Staffing Anesthesiologist: Marcene DuosFITZGERALD, Magn[place] Mattila Performed: anesthesiologist  Preanesthetic Checklist Completed: patient identified, site marked, surgical consent, pre-op evaluation, timeout performed, IV checked, risks and benefits discussed and monitors and equipment checked Spinal Block Patient position: sitting Prep: site prepped and draped and DuraPrep Patient monitoring: blood pressure, continuous pulse ox and heart rate Location: L4-5 Injection technique: single-shot Needle Needle type: Pencan  Needle gauge: 24 G Needle length: 9 cm Needle insertion depth: 6 cm

## 2017-07-14 NOTE — Transfer of Care (Signed)
Immediate Anesthesia Transfer of Care Note  Patient: Mario Edwards  Procedure(s) Performed: RIGHT TOTAL KNEE ARTHROPLASTY (Right Knee)  Patient Location: PACU  Anesthesia Type:Spinal  Level of Consciousness: awake, alert  and oriented  Airway & Oxygen Therapy: Patient Spontanous Breathing and Patient connected to face mask oxygen  Post-op Assessment: Report given to RN  Post vital signs: Reviewed and stable  Last Vitals:  Vitals:   07/14/17 1152 07/14/17 1200  BP: (!) 131/59 127/60  Pulse: (!) 59 (!) 51  Resp:  14  Temp: 36.5 C   SpO2: 100% 100%    Last Pain:  Vitals:   07/14/17 0815  TempSrc: Oral         Complications: No apparent anesthesia complications

## 2017-07-14 NOTE — Evaluation (Signed)
Physical Therapy Evaluation Patient Details Name: Mario Edwards MRN: [id number] DOB: 10/08/1931 Today's Date: 07/14/2017   History of Present Illness  Pt is an 83 year old male s/p R TKA with hx of L TKA 2014  Clinical Impression  Pt is s/p TKA resulting in the deficits listed below (see PT Problem List).  Pt will benefit from skilled PT to increase their independence and safety with mobility to allow discharge to the venue listed below.  Pt assisted with ambulation POD #0 and plans to d/c to SNF due to living alone.     Follow Up Recommendations SNF    Equipment Recommendations  None recommended by PT    Recommendations for Other Services       Precautions / Restrictions Precautions Precautions: Fall;Knee Required Braces or Orthoses: Knee Immobilizer - Right Restrictions Other Position/Activity Restrictions: WBAT      Mobility  Bed Mobility Overal bed mobility: Needs Assistance Bed Mobility: Supine to Sit     Supine to sit: Min guard;HOB elevated     General bed mobility comments: verbal cues for technique  Transfers Overall transfer level: Needs assistance Equipment used: Rolling walker (2 wheeled) Transfers: Sit to/from Stand Sit to Stand: Min assist         General transfer comment: assist to rise and steady as well as control descent, verbal cues for UE and LE positioning  Ambulation/Gait Ambulation/Gait assistance: Min guard Ambulation Distance (Feet): 60 Feet Assistive device: Rolling walker (2 wheeled) Gait Pattern/deviations: Step-to pattern;Decreased stance time - right;Antalgic     General Gait Details: verbal cues for sequence, RW positioning, step length, posture  Stairs            Wheelchair Mobility    Modified Rankin (Stroke Patients Only)       Balance                                             Pertinent Vitals/Pain Pain Assessment: 0-10 Pain Score: 6  Pain Location: R knee with ambulation Pain  Descriptors / Indicators: Sore Pain Intervention(s): Monitored during session;Limited activity within patient's tolerance;Repositioned;Ice applied    Home Living Family/patient expects to be discharged to:: Skilled nursing facility Living Arrangements: Alone                    Prior Function Level of Independence: Independent with assistive device(s)         Comments: uses SPC     Hand Dominance        Extremity/Trunk Assessment        Lower Extremity Assessment Lower Extremity Assessment: RLE deficits/detail RLE Deficits / Details: unable to perform SLR without lag, maintained KI, ROM TBA       Communication   Communication: No difficulties  Cognition Arousal/Alertness: Awake/alert Behavior During Therapy: WFL for tasks assessed/performed Overall Cognitive Status: Within Functional Limits for tasks assessed                                        General Comments      Exercises     Assessment/Plan    PT Assessment Patient needs continued PT services  PT Problem List Decreased range of motion;Decreased strength;Decreased mobility;Decreased knowledge of use of DME;Pain;Decreased knowledge of precautions  PT Treatment Interventions Functional mobility training;Gait training;Therapeutic exercise;Patient/family education;DME instruction;Therapeutic activities    PT Goals (Current goals can be found in the Care Plan section)  Acute Rehab PT Goals PT Goal Formulation: With patient Time For Goal Achievement: 07/18/17 Potential to Achieve Goals: Good    Frequency 7X/week   Barriers to discharge        Co-evaluation               AM-PAC PT "6 Clicks" Daily Activity  Outcome Measure Difficulty turning over in bed (including adjusting bedclothes, sheets and blankets)?: A Little Difficulty moving from lying on back to sitting on the side of the bed? : Unable Difficulty sitting down on and standing up from a chair with arms  (e.g., wheelchair, bedside commode, etc,.)?: Unable Help needed moving to and from a bed to chair (including a wheelchair)?: A Little Help needed walking in hospital room?: A Little Help needed climbing 3-5 steps with a railing? : A Lot 6 Click Score: 13    End of Session Equipment Utilized During Treatment: Gait belt;Right knee immobilizer Activity Tolerance: Patient tolerated treatment well Patient left: in chair;with chair alarm set;with call bell/phone within reach Nurse Communication: Mobility status PT Visit Diagnosis: Other abnormalities of gait and mobility (R26.89);Pain Pain - Right/Left: Right Pain - part of body: Knee    Time: [id number] PT Time Calculation (min) (ACUTE ONLY): 14 min   Charges:   PT Evaluation $PT Eval Low Complexity: 1 Low     PT G Codes:       Zenovia JarredKati Yvonne Petite, PT, DPT 07/14/2017 Pager: [phone] Maida SaleLEMYRE,KATHrine E 07/14/2017, 5:34 PM

## 2017-07-15 LAB — BASIC METABOLIC PANEL
Anion gap: 8 (ref 5–15)
BUN: 17 mg/dL (ref 6–20)
CO2: 24 mmol/L (ref 22–32)
Calcium: 8.8 mg/dL — ABNORMAL LOW (ref 8.9–10.3)
Chloride: 106 mmol/L (ref 101–111)
Creatinine, Ser: 1.2 mg/dL (ref 0.61–1.24)
GFR calc Af Amer: >60 mL/min (ref >60)
GFR, EST NON AFRICAN AMERICAN: 54 mL/min — AB (ref >60)
GLUCOSE: 126 mg/dL — AB (ref 65–99)
Potassium: 4.3 mmol/L (ref 3.5–5.1)
Sodium: 138 mmol/L (ref 135–145)

## 2017-07-15 LAB — CBC
HEMATOCRIT: 32.9 % — AB (ref 39.0–52.0)
Hemoglobin: 11.3 g/dL — ABNORMAL LOW (ref 13.0–17.0)
MCH: 32.5 pg (ref 26.0–34.0)
MCHC: 34.3 g/dL (ref 30.0–36.0)
MCV: 94.5 fL (ref 78.0–100.0)
Platelets: 194 10*3/uL (ref 150–400)
RBC: 3.48 MIL/uL — ABNORMAL LOW (ref 4.22–5.81)
RDW: 13.4 % (ref 11.5–15.5)
WBC: 10 10*3/uL (ref 4.0–10.5)

## 2017-07-15 MED ORDER — RIVAROXABAN 10 MG PO TABS: 10.0000 mg | ORAL_TABLET | Freq: Every day | ORAL | 0 refills | Status: AC

## 2017-07-15 MED ORDER — TRAMADOL HCL 50 MG PO TABS
50.0000 mg | ORAL_TABLET | Freq: Four times a day (QID) | ORAL | Status: DC | PRN
  Administered 2017-07-15 – 2017-07-16 (×2): 100 mg via ORAL
  Administered 2017-07-16 – 2017-07-17 (×3): 50 mg via ORAL
  Filled 2017-07-15: qty 2
  Filled 2017-07-15: qty 1
  Filled 2017-07-15: qty 2
  Filled 2017-07-15 (×2): qty 1

## 2017-07-15 MED ORDER — FLEET ENEMA 7-19 GM/118ML RE ENEM: 1.0000 | ENEMA | Freq: Once | RECTAL | 0 refills | Status: AC | PRN

## 2017-07-15 MED ORDER — OXYCODONE HCL 5 MG PO TABS: 5.0000 mg | ORAL_TABLET | ORAL | 0 refills | Status: DC | PRN

## 2017-07-15 MED ORDER — DIPHENHYDRAMINE HCL 12.5 MG/5ML PO ELIX: 12.5000 mg | ORAL_SOLUTION | ORAL | 0 refills | Status: AC | PRN

## 2017-07-15 MED ORDER — TRAMADOL HCL 50 MG PO TABS: 50.0000 mg | ORAL_TABLET | Freq: Four times a day (QID) | ORAL | 0 refills | Status: DC | PRN

## 2017-07-15 MED ORDER — METHOCARBAMOL 500 MG PO TABS: 500.0000 mg | ORAL_TABLET | Freq: Four times a day (QID) | ORAL | 0 refills | Status: DC | PRN

## 2017-07-15 NOTE — Clinical Social Work Placement (Addendum)
8:52 AM 07/17/2017 Patient medically stable to discharge to SNF today: [name] Patient will transport by EMS. Facility notified and agreeable for patient to admit today. All DC paperwork sent to the facility for review. Patient to update his family per request.  No other needs. DC to SNF.  CLINICAL SOCIAL WORK PLACEMENT  NOTE  Date:  07/15/2017  Patient Details  Name: Mario HeidelbergJohn D Cosman MRN: [id number] Date of Birth: 20-Jan-1932  Clinical Social Work is seeking post-discharge placement for this patient at the Skilled  Nursing Facility level of care (*CSW will initial, date and re-position this form in  chart as items are completed):  Yes   Patient/family provided with [facility] Clinical Social Work Department's list of facilities offering this level of care within the geographic area requested by the patient (or if unable, by the patient's family).  Yes   Patient/family informed of their freedom to choose among providers that offer the needed level of care, that participate in Medicare, Medicaid or managed care program needed by the patient, have an available bed and are willing to accept the patient.  Yes   Patient/family informed of [facility], as well as of the fact that they are under no obligation to receive care at these facilities.  PASRR submitted to EDS on       PASRR number received on       Existing PASRR number confirmed on 07/15/17     FL2 transmitted to all facilities in geographic area requested by pt/family on 07/15/17     FL2 transmitted to all facilities within larger geographic area on       Patient informed that his/her managed care company has contracts with or will negotiate with certain facilities, including the following:            Patient/family informed of bed offers received.  07/17/2017   [facility]  Physician recommends and patient chooses bed at       SNF Patient to be transferred to   on  .  07/17/2017   Patient to be transferred to facility by     EMS  Patient family notified on   of transfer.  Patient to notify  Name of family member notified:        PHYSICIAN Please sign FL2     Additional Comment:    _______________________________________________ Raye Sorrowoble, Alayjah Boehringer N, LCSW 07/15/2017, 9:47 AM

## 2017-07-15 NOTE — Discharge Summary (Signed)
Physician Discharge Summary   Patient ID: Mario Edwards MRN: [id number] DOB/AGE: 1932/01/13 83 y.o.  Admit date: 07/14/2017 Discharge date: 07-17-2017  Primary Diagnosis:  Osteoarthritis  Right knee(s)  Admission Diagnoses:  Past Medical History:  Diagnosis Date  . Arthritis   . Coronary artery disease   . GERD (gastroesophageal reflux disease)   . Headache(784.0)    migraines  . Peripheral vascular disease ([place])   . Pneumonia    years ago   Discharge Diagnoses:   Active Problems:   OA (osteoarthritis) of knee  Estimated body mass index is 24.41 kg/m as calculated from the following:   Height as of this encounter: _0  (1.854 m).   Weight as of this encounter: 83.9 kg (185 lb).  Procedure:  Procedure(s) (LRB): RIGHT TOTAL KNEE ARTHROPLASTY (Right)   Consults: None  HPI: Mario Edwards is a 83 y.o. year old male with end stage OA of his right knee with progressively worsening pain and dysfunction. He has constant pain, with activity and at rest and significant functional deficits with difficulties even with ADLs. He has had extensive non-op management including analgesics, injections of cortisone and viscosupplements, and home exercise program, but remains in significant pain with significant dysfunction. Radiographs show bone on bone arthritis bone on bone medial and patellofemoral with varus deformity. He presents now for right Total Knee Arthroplasty.    Laboratory Data: Admission on 07/14/2017  Component Date Value Ref Range Status  . WBC 07/15/2017 10.0  4.0 - 10.5 K/uL Final  . RBC 07/15/2017 3.48* 4.22 - 5.81 MIL/uL Final  . Hemoglobin 07/15/2017 11.3* 13.0 - 17.0 g/dL Final  . HCT 07/15/2017 32.9* 39.0 - 52.0 % Final  . MCV 07/15/2017 94.5  78.0 - 100.0 fL Final  . MCH 07/15/2017 32.5  26.0 - 34.0 pg Final  . MCHC 07/15/2017 34.3  30.0 - 36.0 g/dL Final  . RDW 07/15/2017 13.4  11.5 - 15.5 % Final  . Platelets 07/15/2017 194  150 - 400 K/uL Final  . Sodium  07/15/2017 138  135 - 145 mmol/L Final  . Potassium 07/15/2017 4.3  3.5 - 5.1 mmol/L Final  . Chloride 07/15/2017 106  101 - 111 mmol/L Final  . CO2 07/15/2017 24  22 - 32 mmol/L Final  . Glucose, Bld 07/15/2017 126* 65 - 99 mg/dL Final  . BUN 07/15/2017 17  6 - 20 mg/dL Final  . Creatinine, Ser 07/15/2017 1.20  0.61 - 1.24 mg/dL Final  . Calcium 07/15/2017 8.8* 8.9 - 10.3 mg/dL Final  . GFR calc non Af Amer 07/15/2017 54* >60 mL/min Final  . GFR calc Af Amer 07/15/2017 >60  >60 mL/min Final   Comment: (NOTE) The eGFR has been calculated using the CKD EPI equation. This calculation has not been validated in all clinical situations. eGFR's persistently <60 mL/min signify possible Chronic Kidney Disease.   . Anion gap 07/15/2017 8  5 - 15 Final  . WBC 07/16/2017 9.2  4.0 - 10.5 K/uL Final  . RBC 07/16/2017 3.49* 4.22 - 5.81 MIL/uL Final  . Hemoglobin 07/16/2017 11.1* 13.0 - 17.0 g/dL Final  . HCT 07/16/2017 33.1* 39.0 - 52.0 % Final  . MCV 07/16/2017 94.8  78.0 - 100.0 fL Final  . MCH 07/16/2017 31.8  26.0 - 34.0 pg Final  . MCHC 07/16/2017 33.5  30.0 - 36.0 g/dL Final  . RDW 07/16/2017 13.5  11.5 - 15.5 % Final  . Platelets 07/16/2017 183  150 - 400 K/uL Final  .  Sodium 07/16/2017 137  135 - 145 mmol/L Final  . Potassium 07/16/2017 4.1  3.5 - 5.1 mmol/L Final  . Chloride 07/16/2017 102  101 - 111 mmol/L Final  . CO2 07/16/2017 26  22 - 32 mmol/L Final  . Glucose, Bld 07/16/2017 104* 65 - 99 mg/dL Final  . BUN 07/16/2017 21* 6 - 20 mg/dL Final  . Creatinine, Ser 07/16/2017 1.09  0.61 - 1.24 mg/dL Final  . Calcium 07/16/2017 8.9  8.9 - 10.3 mg/dL Final  . GFR calc non Af Amer 07/16/2017 >60  >60 mL/min Final  . GFR calc Af Amer 07/16/2017 >60  >60 mL/min Final   Comment: (NOTE) The eGFR has been calculated using the CKD EPI equation. This calculation has not been validated in all clinical situations. eGFR's persistently <60 mL/min signify possible Chronic Kidney Disease.   .  Anion gap 07/16/2017 9  5 - 15 Final  . WBC 07/17/2017 7.7  4.0 - 10.5 K/uL Final  . RBC 07/17/2017 3.33* 4.22 - 5.81 MIL/uL Final  . Hemoglobin 07/17/2017 10.5* 13.0 - 17.0 g/dL Final  . HCT 07/17/2017 31.7* 39.0 - 52.0 % Final  . MCV 07/17/2017 95.2  78.0 - 100.0 fL Final  . MCH 07/17/2017 31.5  26.0 - 34.0 pg Final  . MCHC 07/17/2017 33.1  30.0 - 36.0 g/dL Final  . RDW 07/17/2017 13.7  11.5 - 15.5 % Final  . Platelets 07/17/2017 181  150 - 400 K/uL Final  Hospital Outpatient Visit on 07/08/2017  Component Date Value Ref Range Status  . aPTT 07/08/2017 29  24 - 36 seconds Final  . WBC 07/08/2017 5.7  4.0 - 10.5 K/uL Final  . RBC 07/08/2017 4.14* 4.22 - 5.81 MIL/uL Final  . Hemoglobin 07/08/2017 13.3  13.0 - 17.0 g/dL Final  . HCT 07/08/2017 39.2  39.0 - 52.0 % Final  . MCV 07/08/2017 94.7  78.0 - 100.0 fL Final  . MCH 07/08/2017 32.1  26.0 - 34.0 pg Final  . MCHC 07/08/2017 33.9  30.0 - 36.0 g/dL Final  . RDW 07/08/2017 13.6  11.5 - 15.5 % Final  . Platelets 07/08/2017 196  150 - 400 K/uL Final  . Sodium 07/08/2017 137  135 - 145 mmol/L Final  . Potassium 07/08/2017 4.7  3.5 - 5.1 mmol/L Final  . Chloride 07/08/2017 103  101 - 111 mmol/L Final  . CO2 07/08/2017 24  22 - 32 mmol/L Final  . Glucose, Bld 07/08/2017 100* 65 - 99 mg/dL Final  . BUN 07/08/2017 24* 6 - 20 mg/dL Final  . Creatinine, Ser 07/08/2017 1.43* 0.61 - 1.24 mg/dL Final  . Calcium 07/08/2017 9.6  8.9 - 10.3 mg/dL Final  . Total Protein 07/08/2017 7.3  6.5 - 8.1 g/dL Final  . Albumin 07/08/2017 4.5  3.5 - 5.0 g/dL Final  . AST 07/08/2017 24  15 - 41 U/L Final  . ALT 07/08/2017 17  17 - 63 U/L Final  . Alkaline Phosphatase 07/08/2017 69  38 - 126 U/L Final  . Total Bilirubin 07/08/2017 0.7  0.3 - 1.2 mg/dL Final  . GFR calc non Af Amer 07/08/2017 44* >60 mL/min Final  . GFR calc Af Amer 07/08/2017 51* >60 mL/min Final   Comment: (NOTE) The eGFR has been calculated using the CKD EPI equation. This calculation  has not been validated in all clinical situations. eGFR's persistently <60 mL/min signify possible Chronic Kidney Disease.   . Anion gap 07/08/2017 10  5 - 15 Final  .  Prothrombin Time 07/08/2017 12.9  11.4 - 15.2 seconds Final  . INR 07/08/2017 0.98   Final  . ABO/RH(D) 07/08/2017 O POS   Final  . Antibody Screen 07/08/2017 NEG   Final  . Sample Expiration 07/08/2017 07/17/2017   Final  . Extend sample reason 07/08/2017 NO TRANSFUSIONS OR PREGNANCY IN THE PAST 3 MONTHS   Final  . MRSA, PCR 07/08/2017 NEGATIVE  NEGATIVE Final  . Staphylococcus aureus 07/08/2017 NEGATIVE  NEGATIVE Final   Comment: (NOTE) The Xpert SA Assay (FDA approved for NASAL specimens in patients 48 years of age and older), is one component of a comprehensive surveillance program. It is not intended to diagnose infection nor to guide or monitor treatment.      X-Rays:No results found.  EKG: Orders placed or performed during the hospital encounter of 07/08/17  . EKG 12 lead  . EKG 12 lead     Hospital Course: Mario Edwards is a 83 y.o. who was admitted to [facility]. They were brought to the operating room on 07/14/2017 and underwent Procedure(s): RIGHT TOTAL KNEE ARTHROPLASTY.  Patient tolerated the procedure well and was later transferred to the recovery room and then to the orthopaedic floor for postoperative care.  They were given PO and IV analgesics for pain control following their surgery.  They were given 24 hours of postoperative antibiotics of  Anti-infectives    Start     Dose/Rate Route Frequency Ordered Stop   07/14/17 1630  ceFAZolin (ANCEF) IVPB 2g/100 mL premix     2 g 200 mL/hr over 30 Minutes Intravenous Every 6 hours 07/14/17 1301 07/14/17 2335   07/14/17 0757  ceFAZolin (ANCEF) 2-4 GM/100ML-% IVPB    Comments:  Harvell, Gwendolyn  : cabinet override      07/14/17 0757 07/14/17 1020   07/14/17 0752  ceFAZolin (ANCEF) IVPB 2g/100 mL premix     2 g 200 mL/hr over 30 Minutes  Intravenous On call to O.R. 07/14/17 3810 07/14/17 1029     and started on DVT prophylaxis in the form of Xarelto.   PT and OT were ordered for total joint protocol.  Discharge planning consulted to help with postop disposition and equipment needs.  Patient had a good night on the evening of surgery. Walked 60 feet with therapy day of surgery. They started to get up OOB with therapy on day one again. Hemovac drain was pulled without difficulty.  Continued to work with therapy into day two.  Dressing was changed on day two and the incision was healing well.   Patient seen in rounds for Dr. Wynelle Link on POD 3.  Had a little bit of confusion the night before, questionable sundowning, questionable medications.  Narcotics stopped.  Better the morning of day three and oriented to time, place, and person. Sitting up in chair. Patient was well, but has had some minor complaints of pain in the knee, requiring pain medications.  Denies N/V/SOB/CP.  The patient had progressed with therapy and meeting their goals.  Incision was healing well.  Patient was seen in rounds on POD 3 and was ready to go to the SNF of choice.  Discharge to SNF Diet - Cardiac diet Follow up - in 2 weeks Activity - WBAT Disposition - Skilled nursing facility Condition Upon Discharge - Stable D/C Meds - See DC Summary DVT Prophylaxis - Xarelto   Discharge Instructions    Call MD / Call 911    Complete by:  As directed  If you experience chest pain or shortness of breath, CALL 911 and be transported to the hospital emergency room.  If you develope a fever above 101 F, pus (white drainage) or increased drainage or redness at the wound, or calf pain, call your surgeon's office.   Change dressing    Complete by:  As directed    Change dressing daily with sterile 4 x 4 inch gauze dressing and apply TED hose. Do not submerge the incision under water.   Constipation Prevention    Complete by:  As directed    Drink plenty of fluids.  Prune  juice may be helpful.  You may use a stool softener, such as Colace (over the counter) 100 mg twice a day.  Use MiraLax (over the counter) for constipation as needed.   Diet - low sodium heart healthy    Complete by:  As directed    Discharge instructions    Complete by:  As directed    Take Xarelto for two and a half more weeks, then discontinue Xarelto. Once the patient has completed the Xarelto, they may resume the 325 mg Aspirin.   Pick up stool softner and laxative for home use following surgery while on pain medications. Do not submerge incision under water. Please use good hand washing techniques while changing dressing each day. May shower starting three days after surgery. Please use a clean towel to pat the incision dry following showers. Continue to use ice for pain and swelling after surgery. Do not use any lotions or creams on the incision until instructed by your surgeon.  Wear both TED hose on both legs during the day every day for three weeks, but may remove the TED hose at night at home.  Postoperative Constipation Protocol  Constipation - defined medically as fewer than three stools per week and severe constipation as less than one stool per week.  One of the most common issues patients have following surgery is constipation.  Even if you have a regular bowel pattern at home, your normal regimen is likely to be disrupted due to multiple reasons following surgery.  Combination of anesthesia, postoperative narcotics, change in appetite and fluid intake all can affect your bowels.  In order to avoid complications following surgery, here are some recommendations in order to help you during your recovery period.  Colace (docusate) - Pick up an over-the-counter form of Colace or another stool softener and take twice a day as long as you are requiring postoperative pain medications.  Take with a full glass of water daily.  If you experience loose stools or diarrhea, hold the  colace until you stool forms back up.  If your symptoms do not get better within 1 week or if they get worse, check with your doctor.  Dulcolax (bisacodyl) - Pick up over-the-counter and take as directed by the product packaging as needed to assist with the movement of your bowels.  Take with a full glass of water.  Use this product as needed if not relieved by Colace only.   MiraLax (polyethylene glycol) - Pick up over-the-counter to have on hand.  MiraLax is a solution that will increase the amount of water in your bowels to assist with bowel movements.  Take as directed and can mix with a glass of water, juice, soda, coffee, or tea.  Take if you go more than two days without a movement. Do not use MiraLax more than once per day. Call your doctor if you are still  constipated or irregular after using this medication for 7 days in a row.  If you continue to have problems with postoperative constipation, please contact the office for further assistance and recommendations.  If you experience "the worst abdominal pain ever" or develop nausea or vomiting, please contact the office immediatly for further recommendations for treatment.    Do not put a pillow under the knee. Place it under the heel.    Complete by:  As directed    Do not sit on low chairs, stoools or toilet seats, as it may be difficult to get up from low surfaces    Complete by:  As directed    Driving restrictions    Complete by:  As directed    No driving until released by the physician.   Increase activity slowly as tolerated    Complete by:  As directed    Lifting restrictions    Complete by:  As directed    No lifting until released by the physician.   Patient may shower    Complete by:  As directed    You may shower without a dressing once there is no drainage.  Do not wash over the wound.  If drainage remains, do not shower until drainage stops.   TED hose    Complete by:  As directed    Use stockings (TED hose) for 3  weeks on both leg(s).  You may remove them at night for sleeping.   Weight bearing as tolerated    Complete by:  As directed    Laterality:  right   Extremity:  Lower     Allergies as of 07/17/2017   No Known Allergies     Medication List    STOP taking these medications   aspirin 325 MG tablet   metoCLOPramide 5 MG tablet Commonly known as:  REGLAN   ondansetron 4 MG tablet Commonly known as:  ZOFRAN   oxyCODONE 5 MG immediate release tablet Commonly known as:  Oxy IR/ROXICODONE     TAKE these medications   acetaminophen 325 MG tablet Commonly known as:  TYLENOL Take 2 tablets (650 mg total) by mouth every 6 (six) hours as needed for mild pain (or Fever >/= 101).   atorvastatin 40 MG tablet Commonly known as:  LIPITOR Take 40 mg by mouth every morning.   bisacodyl 10 MG suppository Commonly known as:  DULCOLAX Place 1 suppository (10 mg total) rectally daily as needed for moderate constipation.   diphenhydrAMINE 12.5 MG/5ML elixir Commonly known as:  BENADRYL Take 5-10 mLs (12.5-25 mg total) by mouth every 4 (four) hours as needed for itching.   DSS 100 MG Caps Take 100 mg by mouth 2 (two) times daily.   methocarbamol 500 MG tablet Commonly known as:  ROBAXIN Take 1 tablet (500 mg total) by mouth every 6 (six) hours as needed for muscle spasms.   metoprolol succinate 50 MG 24 hr tablet Commonly known as:  TOPROL-XL Take 50 mg by mouth every morning. Take with or immediately following a meal.   polyethylene glycol packet Commonly known as:  MIRALAX / GLYCOLAX Take 17 g by mouth daily as needed for mild constipation.   POTASSIUM PO Take 1 tablet by mouth 2 (two) times daily.   ranitidine 150 MG tablet Commonly known as:  ZANTAC Take 150 mg by mouth daily.   rivaroxaban 10 MG Tabs tablet Commonly known as:  XARELTO Take 1 tablet (10 mg total) by mouth daily with breakfast.  Take Xarelto for two and a half more weeks following discharge from the hospital,  then discontinue Xarelto. Once the patient has completed the Xarelto, they may resume the 325 mg Aspirin. What changed:  additional instructions   sodium phosphate 7-19 GM/118ML Enem Place 133 mLs (1 enema total) rectally once as needed for severe constipation.   traMADol 50 MG tablet Commonly known as:  ULTRAM Take 1-2 tablets (50-100 mg total) by mouth every 6 (six) hours as needed. What changed:  reasons to take this            Discharge Care Instructions        Start     Ordered   07/15/17 0000  Weight bearing as tolerated    Question Answer Comment  Laterality right   Extremity Lower      07/15/17 2217   07/15/17 0000  Change dressing    Comments:  Change dressing daily with sterile 4 x 4 inch gauze dressing and apply TED hose. Do not submerge the incision under water.   07/15/17 2217     Follow-up Information    Gaynelle Arabian, MD. Schedule an appointment as soon as possible for a visit on 07/29/2017.   Specialty:  Orthopedic Surgery Contact information: [address] [place] [address] [phone]           Signed: Arlee Muslim, PA-C Orthopaedic Surgery 07/17/2017, 8:27 AM

## 2017-07-15 NOTE — Clinical Social Work Note (Signed)
Clinical Social Work Assessment  Patient Details  Name: Mario Edwards MRN: [id number] Date of Birth: 1932/02/24  Date of referral:  07/15/17               Reason for consult:  Facility Placement, Discharge Planning                Permission sought to share information with:  Case Manager, Facility Sport and exercise psychologist, Family Supports Permission granted to share information::  Yes, Verbal Permission Granted  Name::        Agency::  SNF placement: [facility]  Relationship::     Contact Information:     Housing/Transportation Living arrangements for the past 2 months:  [place] of Information:  Patient, Medical Team, Case Manager Patient Interpreter Needed:  None Criminal Activity/Legal Involvement Pertinent to Current Situation/Hospitalization:  No - Comment as needed Significant Relationships:  Adult Children, Other Family Members Lives with:  Self Do you feel safe going back to the place where you live?  No Need for family participation in patient care:  No (Coment)  Care giving concerns:  Patient admitted to [facility] for surgery RIGHT TOTAL KNEE ARTHROPLASTY (Right) as a surgical intervention.  Patient is from home and reports completing short term rehab in the past (3 times, most recent 3 years ago).  Reports he is wanting to go to [name] and has given permission to send referral.   Social Worker assessment / plan:  Assessment completed. Consult for discharge planning: SNF being recommended. LCSW met with patient who was alert and oriented x4.  Reports he is in good spirits and understands SNF and plans. Wanting [facility]. LCSW spoke with Heather at [facility].  Reports she has been waiting on him for a month and he has been calling weekly to secure his bed. Bed available at [facility] and LCSW confirmed when patient is medically stable for discharge.  Plan: SNF [facility]  Employment status:  Retired Forensic scientist:   Medicare PT Recommendations:  [place] / Referral to community resources:  [place]  Patient/Family's Response to care:  Understanding and agreeable  Patient/Family's Understanding of and Emotional Response to Diagnosis, Current Treatment, and Prognosis:  Patient understanding of need for SNF at DC.  Agreeable for short term and reports he has had great outcomes at previous facility. He has been proactive with facility and keeping them up to date each week regarding his surgical interventions.  Emotional Assessment Appearance:  Appears stated age Attitude/Demeanor/Rapport:    Affect (typically observed):  Accepting, Adaptable, Calm, Pleasant Orientation:  Oriented to Self, Oriented to Place, Oriented to  Time, Oriented to Situation Alcohol / Substance use:  Not Applicable Psych involvement (Current and /or in the community):  No (Comment)  Discharge Needs  Concerns to be addressed:  Denies Needs/Concerns at this time Readmission within the last 30 days:  No Current discharge risk:  None Barriers to Discharge:  No Barriers Identified   [place], LCSW 07/15/2017, 9:49 AM

## 2017-07-15 NOTE — Progress Notes (Signed)
Physical Therapy Treatment Patient Details Name: Mario Edwards MRN: [id number] DOB: [birth year] Today's Date: 07/15/2017    History of Present Illness Pt is an 83 year old male s/p R TKA with hx of L TKA 2014    PT Comments    Pt ambulated in hallway and performed a few exercises in recliner.  Follow Up Recommendations  SNF     Equipment Recommendations  None recommended by PT    Recommendations for Other Services       Precautions / Restrictions Precautions Precautions: Fall;Knee Required Braces or Orthoses: Knee Immobilizer - Right Restrictions Other Position/Activity Restrictions: WBAT    Mobility  Bed Mobility Overal bed mobility: Needs Assistance Bed Mobility: Supine to Sit     Supine to sit: Min guard;HOB elevated     General bed mobility comments: verbal cues for technique  Transfers Overall transfer level: Needs assistance Equipment used: Rolling walker (2 wheeled) Transfers: Sit to/from Stand Sit to Stand: Min assist         General transfer comment: assist to rise and steady as well as control descent, verbal cues for UE and LE positioning  Ambulation/Gait Ambulation/Gait assistance: Min assist Ambulation Distance (Feet): 100 Feet Assistive device: Rolling walker (2 wheeled) Gait Pattern/deviations: Step-to pattern;Decreased stance time - right;Antalgic     General Gait Details: verbal cues for sequence, RW positioning, step length, posture, max cues for slowing down as increase in speed decreased stability requiring steadying assist   Stairs            Wheelchair Mobility    Modified Rankin (Stroke Patients Only)       Balance                                            Cognition Arousal/Alertness: Awake/alert Behavior During Therapy: WFL for tasks assessed/performed Overall Cognitive Status: Within Functional Limits for tasks assessed                                        Exercises  Total Joint Exercises Ankle Circles/Pumps: AROM;Both;10 reps Quad Sets: AROM;Right;10 reps Heel Slides: AAROM;Right;10 reps Hip ABduction/ADduction: AAROM;Right;10 reps Goniometric ROM: approx 35* AAROM R knee flexion    General Comments        Pertinent Vitals/Pain Pain Assessment: 0-10 Pain Score: 5  Pain Location: R knee with ambulation Pain Descriptors / Indicators: Aching;Sore Pain Intervention(s): Limited activity within patient's tolerance;Repositioned;Monitored during session;Ice applied    Home Living                      Prior Function            PT Goals (current goals can now be found in the care plan section) Progress towards PT goals: Progressing toward goals    Frequency    7X/week      PT Plan Current plan remains appropriate    Co-evaluation              AM-PAC PT "6 Clicks" Daily Activity  Outcome Measure  Difficulty turning over in bed (including adjusting bedclothes, sheets and blankets)?: A Little Difficulty moving from lying on back to sitting on the side of the bed? : A Lot Difficulty sitting down on and standing up from a chair with  arms (e.g., wheelchair, bedside commode, etc,.)?: Unable Help needed moving to and from a bed to chair (including a wheelchair)?: A Little Help needed walking in hospital room?: A Little Help needed climbing 3-5 steps with a railing? : A Lot 6 Click Score: 14    End of Session Equipment Utilized During Treatment: Right knee immobilizer Activity Tolerance: Patient tolerated treatment well Patient left: in chair;with chair alarm set;with call bell/phone within reach Nurse Communication: Mobility status PT Visit Diagnosis: Other abnormalities of gait and mobility (R26.89);Pain Pain - Right/Left: Right Pain - part of body: Knee     Time: [id number] PT Time Calculation (min) (ACUTE ONLY): 19 min  Charges:  $Gait Training: 8-22 mins                    G Codes:       Zenovia JarredKati Latonga Ponder, PT,  DPT 07/15/2017 Pager: [phone]   Maida SaleLEMYRE,KATHrine E 07/15/2017, 12:55 PM

## 2017-07-15 NOTE — Progress Notes (Signed)
   Subjective: 1 Day Post-Op Procedure(s) (LRB): RIGHT TOTAL KNEE ARTHROPLASTY (Right) Patient reports pain as mild.   Patient seen in rounds for Dr. Lequita HaltAluisio.   Patient had a good night Patient is well, but has had some minor complaints of pain in the knee, requiring pain medications We will resume therapy today. Walked 60 feet with therapy Plan is to go Skilled nursing facility after hospital stay.  Wants to look into [facility]  Objective: Vital signs in last 24 hours: Temp:  [97.6 F (36.4 C)-98.5 F (36.9 C)] 98.3 F (36.8 C) (10/30 1000) Pulse Rate:  [57-65] 65 (10/30 1000) Resp:  [14-16] 15 (10/30 1000) BP: (93-168)/(52-74) 130/54 (10/30 1000) SpO2:  [98 %-100 %] 98 % (10/30 1000) Weight:  [83.9 kg (185 lb)] 83.9 kg (185 lb) (10/29 1250)  Intake/Output from previous day:  Intake/Output Summary (Last 24 hours) at 07/15/17 1230 Last data filed at 07/15/17 1153  Gross per 24 hour  Intake          4575.01 ml  Output             3304 ml  Net          1271.01 ml    Intake/Output this shift: Total I/O In: 345 [P.O.:240; I.V.:105] Out: 325 [Urine:325]  Labs:  Recent Labs  07/15/17 0534  HGB 11.3*    Recent Labs  07/15/17 0534  WBC 10.0  RBC 3.48*  HCT 32.9*  PLT 194    Recent Labs  07/15/17 0534  NA 138  K 4.3  CL 106  CO2 24  BUN 17  CREATININE 1.20  GLUCOSE 126*  CALCIUM 8.8*   No results for input(s): LABPT, INR in the last 72 hours.  EXAM General - Patient is Alert, Appropriate and Oriented Extremity - Neurovascular intact Sensation intact distally Intact pulses distally Dorsiflexion/Plantar flexion intact Dressing - dressing C/D/I Motor Function - intact, moving foot and toes well on exam.  Hemovac pulled without difficulty.  Past Medical History:  Diagnosis Date  . Arthritis   . Coronary artery disease   . GERD (gastroesophageal reflux disease)   . Headache(784.0)    migraines  . Peripheral vascular disease (HCC)   .  Pneumonia    years ago    Assessment/Plan: 1 Day Post-Op Procedure(s) (LRB): RIGHT TOTAL KNEE ARTHROPLASTY (Right) Active Problems:   OA (osteoarthritis) of knee  Estimated body mass index is 24.41 kg/m as calculated from the following:   Height as of this encounter: 6\' 1"  (1.854 m).   Weight as of this encounter: 83.9 kg (185 lb). Advance diet Up with therapy Plan for discharge tomorrow Discharge to SNF  DVT Prophylaxis - Xarelto Weight-Bearing as tolerated to right leg D/C O2 and Pulse OX and try on Room Air  Avel Peacerew Dalyn Becker, PA-C Orthopaedic Surgery 07/15/2017, 12:30 PM

## 2017-07-15 NOTE — Progress Notes (Signed)
OT Cancellation Note  Patient Details Name: Mario Edwards MRN: [id number] DOB: 12/24/1931   Cancelled Treatment:    Reason Eval/Treat Not Completed: OT screened, no needs identified, will sign off. Pt plans SNF for rehab. Will defer OT evaluation to next venue.  Mario Edwards 07/15/2017, 7:31 AM  Mario Edwards, OTR/L [phone] 07/15/2017

## 2017-07-15 NOTE — NC FL2 (Signed)
[place] MEDICAID FL2 LEVEL OF CARE SCREENING TOOL     IDENTIFICATION  Patient Name: Mario Edwards Birthdate: Mar 09, 1932 Sex: male Admission Date (Current Location): 07/14/2017  [facility] and [place] Number:  Producer, television/film/video and Address:  [facility],  [address], [place] [address]      Provider Number: [id number]  Attending Physician Name and Address:  Ollen Gross, MD  Relative Name and Phone Number:       Current Level of Care: Hospital Recommended Level of Care: Skilled Nursing Facility Prior Approval Number:    Date Approved/Denied:   PASRR Number:   [id number]  Discharge Plan: SNF    Current Diagnoses: Patient Active Problem List   Diagnosis Date Noted  . Hyponatremia 07/22/2013  . Postoperative anemia due to acute blood loss 07/20/2013  . OA (osteoarthritis) of knee 07/19/2013    Orientation RESPIRATION BLADDER Height & Weight     Self, Time, Situation, Place  O2 (2L) Continent Weight: 185 lb (83.9 kg) Height:  6\' 1"  (185.4 cm)  BEHAVIORAL SYMPTOMS/MOOD NEUROLOGICAL BOWEL NUTRITION STATUS      Continent Diet (See DC summary)  AMBULATORY STATUS COMMUNICATION OF NEEDS Skin   Extensive Assist Verbally Surgical wounds, Skin abrasions, Bruising                       Personal Care Assistance Level of Assistance  Bathing, Feeding, Dressing Bathing Assistance: Limited assistance Feeding assistance: Independent Dressing Assistance: Limited assistance     Functional Limitations Info  Sight, Hearing, Speech Sight Info: Adequate Hearing Info: Adequate Speech Info: Adequate    SPECIAL CARE FACTORS FREQUENCY  PT (By licensed PT), OT (By licensed OT)     PT Frequency: 5x a week OT Frequency: 5x a week            Contractures Contractures Info: Not present    Additional Factors Info  Code Status, Allergies Code Status Info: Full Code Allergies Info: NKA           Current Medications (07/15/2017):  This is the  current hospital active medication list Current Facility-Administered Medications  Medication Dose Route Frequency Provider Last Rate Last Dose  . 0.9 %  sodium chloride infusion   Intravenous Continuous Ollen Gross, MD 20 mL/hr at 07/15/17 0630    . acetaminophen (TYLENOL) tablet 650 mg  650 mg Oral Q4H PRN Aluisio, Homero Fellers, MD       Or  . acetaminophen (TYLENOL) suppository 650 mg  650 mg Rectal Q4H PRN Aluisio, Homero Fellers, MD      . acetaminophen (TYLENOL) tablet 1,000 mg  1,000 mg Oral Q6H Ollen Gross, MD   1,000 mg at 07/15/17 0629  . atorvastatin (LIPITOR) tablet 40 mg  40 mg Oral q morning - 10a Ollen Gross, MD   40 mg at 07/15/17 0800  . bisacodyl (DULCOLAX) suppository 10 mg  10 mg Rectal Daily PRN Ollen Gross, MD      . diphenhydrAMINE (BENADRYL) 12.5 MG/5ML elixir 12.5-25 mg  12.5-25 mg Oral Q4H PRN Ollen Gross, MD   25 mg at 07/15/17 0216  . docusate sodium (COLACE) capsule 100 mg  100 mg Oral BID Ollen Gross, MD   100 mg at 07/15/17 0800  . famotidine (PEPCID) tablet 20 mg  20 mg Oral Daily Ollen Gross, MD   20 mg at 07/15/17 0800  . menthol-cetylpyridinium (CEPACOL) lozenge 3 mg  1 lozenge Oral PRN Ollen Gross, MD  Or  . phenol (CHLORASEPTIC) mouth spray 1 spray  1 spray Mouth/Throat PRN Aluisio, Homero FellersFrank, MD      . methocarbamol (ROBAXIN) tablet 500 mg  500 mg Oral Q6H PRN Ollen GrossAluisio, Frank, MD   500 mg at 07/15/17 [id number]   Or  . methocarbamol (ROBAXIN) 500 mg in dextrose 5 % 50 mL IVPB  500 mg Intravenous Q6H PRN Aluisio, Homero FellersFrank, MD      . metoCLOPramide (REGLAN) tablet 5-10 mg  5-10 mg Oral Q8H PRN Ollen GrossAluisio, Frank, MD       Or  . metoCLOPramide (REGLAN) injection 5-10 mg  5-10 mg Intravenous Q8H PRN Aluisio, Homero FellersFrank, MD      . metoprolol succinate (TOPROL-XL) 24 hr tablet 50 mg  50 mg Oral QPC breakfast Ollen GrossAluisio, Frank, MD   50 mg at 07/15/17 0800  . morphine 4 MG/ML injection 1 mg  1 mg Intravenous Q2H PRN Aluisio, Homero FellersFrank, MD      . ondansetron [facility](ZOFRAN) tablet 4 mg   4 mg Oral Q6H PRN Ollen GrossAluisio, Frank, MD       Or  . ondansetron (ZOFRAN) injection 4 mg  4 mg Intravenous Q6H PRN Aluisio, Homero FellersFrank, MD      . oxyCODONE (Oxy IR/ROXICODONE) immediate release tablet 10 mg  10 mg Oral Q3H PRN Ollen GrossAluisio, Frank, MD      . oxyCODONE (Oxy IR/ROXICODONE) immediate release tablet 5 mg  5 mg Oral Q3H PRN Ollen GrossAluisio, Frank, MD   5 mg at 07/15/17 0629  . polyethylene glycol (MIRALAX / GLYCOLAX) packet 17 g  17 g Oral Daily PRN Aluisio, Homero FellersFrank, MD      . rivaroxaban Carlena Hurl(XARELTO) tablet 10 mg  10 mg Oral Q breakfast Ollen GrossAluisio, Frank, MD   10 mg at 07/15/17 0800  . sodium phosphate (FLEET) 7-19 GM/118ML enema 1 enema  1 enema Rectal Once PRN Aluisio, Homero FellersFrank, MD      . traMADol Janean Sark(ULTRAM) tablet 50-100 mg  50-100 mg Oral Q6H PRN Absher, Ky Barbanandall K, [facility]PH         Discharge Medications: Please see discharge summary for a list of discharge medications.  Relevant Imaging Results:  Relevant Lab Results:   Additional Information SSN:  [id number]    Raye SorrowCoble, Kortlynn Poust N, [place]

## 2017-07-15 NOTE — Progress Notes (Signed)
Physical Therapy Treatment Patient Details Name: Mario Edwards MRN: [id number] DOB: [birth year] Today's Date: 07/15/2017    History of Present Illness Pt is an 83 year old male s/p R TKA with hx of L TKA 2014    PT Comments    Pt requesting assist back to bed and agreeable to ambulate prior to return to bed.  Pt required cue to apply KI prior to standing and presents with LOB with sit to stands this afternoon.  Pt reports ease in knee stiffness with ambulation.  Pt assisted back to bed.   Follow Up Recommendations  SNF     Equipment Recommendations  None recommended by PT    Recommendations for Other Services       Precautions / Restrictions Precautions Precautions: Fall;Knee Required Braces or Orthoses: Knee Immobilizer - Right Restrictions Other Position/Activity Restrictions: WBAT    Mobility  Bed Mobility Overal bed mobility: Needs Assistance Bed Mobility: Sit to Supine     Supine to sit: Min guard;HOB elevated Sit to supine: Min assist   General bed mobility comments: verbal cues for technique, assist for LE onto bed  Transfers Overall transfer level: Needs assistance Equipment used: Rolling walker (2 wheeled) Transfers: Sit to/from Stand Sit to Stand: Min assist         General transfer comment: assist to rise and steady, LOB with rise requiring full min assist to correct, verbal cues for UE and LE positioning  Ambulation/Gait Ambulation/Gait assistance: Min assist Ambulation Distance (Feet): 100 Feet Assistive device: Rolling walker (2 wheeled) Gait Pattern/deviations: Step-to pattern;Decreased stance time - right;Antalgic     General Gait Details: verbal cues for sequence, RW positioning, step length, posture, min cues for slowing down, pt reports ease in knee stiffness with gait   Stairs            Wheelchair Mobility    Modified Rankin (Stroke Patients Only)       Balance                                             Cognition Arousal/Alertness: Awake/alert Behavior During Therapy: WFL for tasks assessed/performed Overall Cognitive Status: Within Functional Limits for tasks assessed                                        Exercises Total Joint Exercises Ankle Circles/Pumps: AROM;Both;10 reps Quad Sets: AROM;Right;10 reps Heel Slides: AAROM;Right;10 reps Hip ABduction/ADduction: AAROM;Right;10 reps Goniometric ROM: approx 35* AAROM R knee flexion    General Comments        Pertinent Vitals/Pain Pain Assessment: 0-10 Pain Score: 5  Pain Location: R knee  Pain Descriptors / Indicators: Aching;Sore Pain Intervention(s): Limited activity within patient's tolerance;Monitored during session;Repositioned;Ice applied    Home Living                      Prior Function            PT Goals (current goals can now be found in the care plan section) Progress towards PT goals: Progressing toward goals    Frequency    7X/week      PT Plan Current plan remains appropriate    Co-evaluation              AM-PAC  PT "6 Clicks" Daily Activity  Outcome Measure  Difficulty turning over in bed (including adjusting bedclothes, sheets and blankets)?: A Little Difficulty moving from lying on back to sitting on the side of the bed? : Unable Difficulty sitting down on and standing up from a chair with arms (e.g., wheelchair, bedside commode, etc,.)?: Unable Help needed moving to and from a bed to chair (including a wheelchair)?: A Little Help needed walking in hospital room?: A Little Help needed climbing 3-5 steps with a railing? : A Lot 6 Click Score: 13    End of Session Equipment Utilized During Treatment: Right knee immobilizer Activity Tolerance: Patient tolerated treatment well Patient left: in bed;with call bell/phone within reach;with bed alarm set Nurse Communication: Mobility status PT Visit Diagnosis: Other abnormalities of gait and mobility  (R26.89);Pain Pain - Right/Left: Right Pain - part of body: Knee     Time: [id number] PT Time Calculation (min) (ACUTE ONLY): 12 min  Charges:  $Gait Training: 8-22 mins                    G Codes:       Zenovia JarredKati Markasia Carrol, PT, DPT 07/15/2017 Pager: [phone]  Maida SaleLEMYRE,KATHrine E 07/15/2017, 3:14 PM

## 2017-07-15 NOTE — Progress Notes (Signed)
Plan for d/c to SNF, discharge planning per CSW. [phone] 

## 2017-07-16 LAB — BASIC METABOLIC PANEL
Anion gap: 9 (ref 5–15)
BUN: 21 mg/dL — AB (ref 6–20)
CO2: 26 mmol/L (ref 22–32)
CREATININE: 1.09 mg/dL (ref 0.61–1.24)
Calcium: 8.9 mg/dL (ref 8.9–10.3)
Chloride: 102 mmol/L (ref 101–111)
GFR calc Af Amer: >60 mL/min (ref >60)
Glucose, Bld: 104 mg/dL — ABNORMAL HIGH (ref 65–99)
Potassium: 4.1 mmol/L (ref 3.5–5.1)
SODIUM: 137 mmol/L (ref 135–145)

## 2017-07-16 LAB — CBC
HCT: 33.1 % — ABNORMAL LOW (ref 39.0–52.0)
Hemoglobin: 11.1 g/dL — ABNORMAL LOW (ref 13.0–17.0)
MCH: 31.8 pg (ref 26.0–34.0)
MCHC: 33.5 g/dL (ref 30.0–36.0)
MCV: 94.8 fL (ref 78.0–100.0)
PLATELETS: 183 10*3/uL (ref 150–400)
RBC: 3.49 MIL/uL — ABNORMAL LOW (ref 4.22–5.81)
RDW: 13.5 % (ref 11.5–15.5)
WBC: 9.2 10*3/uL (ref 4.0–10.5)

## 2017-07-16 NOTE — Progress Notes (Signed)
Physical Therapy Treatment Patient Details Name: Mario HeidelbergJohn D Rochel MRN: [id number] DOB: 1931/12/24 Today's Date: 07/16/2017    History of Present Illness Pt is an 83 year old male s/p R TKA with hx of L TKA 2014    PT Comments    Pt reports increased pain and stiffness today so limited ambulation distance.  Pt reports d/c to SNF tomorrow.  Follow Up Recommendations  SNF     Equipment Recommendations  None recommended by PT    Recommendations for Other Services       Precautions / Restrictions Precautions Precautions: Fall;Knee Required Braces or Orthoses: Knee Immobilizer - Right Restrictions Other Position/Activity Restrictions: WBAT    Mobility  Bed Mobility Overal bed mobility: Needs Assistance Bed Mobility: Supine to Sit     Supine to sit: Min assist     General bed mobility comments: verbal cues for technique, assist for LE   Transfers Overall transfer level: Needs assistance Equipment used: Rolling walker (2 wheeled) Transfers: Sit to/from Stand Sit to Stand: Min assist         General transfer comment: assist to rise and steady, verbal cues for UE and LE positioning  Ambulation/Gait Ambulation/Gait assistance: Min assist Ambulation Distance (Feet): 25 Feet Assistive device: Rolling walker (2 wheeled) Gait Pattern/deviations: Step-to pattern;Decreased stance time - right;Antalgic     General Gait Details: verbal cues for sequence, RW positioning, step length, posture, pt with very effortful and painful ambulation so limited distance today to pt tolerance   Stairs            Wheelchair Mobility    Modified Rankin (Stroke Patients Only)       Balance                                            Cognition Arousal/Alertness: Awake/alert Behavior During Therapy: WFL for tasks assessed/performed Overall Cognitive Status: Within Functional Limits for tasks assessed                                         Exercises      General Comments        Pertinent Vitals/Pain Pain Score: 7  Pain Location: R knee  Pain Descriptors / Indicators: Aching;Sore Pain Intervention(s): Repositioned;Limited activity within patient's tolerance;Premedicated before session;Ice applied;Monitored during session    Home Living                      Prior Function            PT Goals (current goals can now be found in the care plan section) Progress towards PT goals: Progressing toward goals    Frequency    7X/week      PT Plan Current plan remains appropriate    Co-evaluation              AM-PAC PT "6 Clicks" Daily Activity  Outcome Measure  Difficulty turning over in bed (including adjusting bedclothes, sheets and blankets)?: A Little Difficulty moving from lying on back to sitting on the side of the bed? : Unable Difficulty sitting down on and standing up from a chair with arms (e.g., wheelchair, bedside commode, etc,.)?: Unable Help needed moving to and from a bed to chair (including a wheelchair)?: A Little Help  needed walking in hospital room?: A Lot Help needed climbing 3-5 steps with a railing? : A Lot 6 Click Score: 12    End of Session Equipment Utilized During Treatment: Right knee immobilizer;Gait belt Activity Tolerance: Patient limited by pain Patient left: in chair;with chair alarm set;with call bell/phone within reach   PT Visit Diagnosis: Other abnormalities of gait and mobility (R26.89);Pain Pain - Right/Left: Right Pain - part of body: Knee     Time: 4098-1191 PT Time Calculation (min) (ACUTE ONLY): 16 min  Charges:  $Gait Training: 8-22 mins                    G Codes:       Zenovia Jarred, PT, DPT 07/16/2017 Pager: [phone]  Maida Sale E 07/16/2017, 12:49 PM

## 2017-07-16 NOTE — Progress Notes (Signed)
   Subjective: 2 Days Post-Op Procedure(s) (LRB): RIGHT TOTAL KNEE ARTHROPLASTY (Right) Patient reports pain as mild.   Patient seen in rounds by Dr. Lequita HaltAluisio. Patient is well, and has had no acute complaints or problems Plan is to go Skilled nursing facility after hospital stay.  Objective: Vital signs in last 24 hours: Temp:  [97.5 F (36.4 C)-98.3 F (36.8 C)] 97.5 F (36.4 C) (10/31 0525) Pulse Rate:  [59-65] 63 (10/31 0835) Resp:  [15-16] 16 (10/31 0525) BP: (126-150)/(54-69) 126/55 (10/31 0835) SpO2:  [93 %-99 %] 93 % (10/31 0525)  Intake/Output from previous day:  Intake/Output Summary (Last 24 hours) at 07/16/17 0917 Last data filed at 07/16/17 0739  Gross per 24 hour  Intake              925 ml  Output             2425 ml  Net            -1500 ml    Intake/Output this shift: Total I/O In: 120 [P.O.:120] Out: -   Labs:  Recent Labs  07/15/17 0534 07/16/17 0535  HGB 11.3* 11.1*    Recent Labs  07/15/17 0534 07/16/17 0535  WBC 10.0 9.2  RBC 3.48* 3.49*  HCT 32.9* 33.1*  PLT 194 183    Recent Labs  07/15/17 0534 07/16/17 0535  NA 138 137  K 4.3 4.1  CL 106 102  CO2 24 26  BUN 17 21*  CREATININE 1.20 1.09  GLUCOSE 126* 104*  CALCIUM 8.8* 8.9   No results for input(s): LABPT, INR in the last 72 hours.  EXAM General - Patient is Alert and Appropriate Extremity - Neurovascular intact Sensation intact distally Dressing/Incision - clean, dry Motor Function - intact, moving foot and toes well on exam.   Past Medical History:  Diagnosis Date  . Arthritis   . Coronary artery disease   . GERD (gastroesophageal reflux disease)   . Headache(784.0)    migraines  . Peripheral vascular disease (HCC)   . Pneumonia    years ago    Assessment/Plan: 2 Days Post-Op Procedure(s) (LRB): RIGHT TOTAL KNEE ARTHROPLASTY (Right) Active Problems:   OA (osteoarthritis) of knee  Estimated body mass index is 24.41 kg/m as calculated from the  following:   Height as of this encounter: 6\' 1"  (1.854 m).   Weight as of this encounter: 83.9 kg (185 lb). Up with therapy Plan for discharge tomorrow Discharge to SNF  DVT Prophylaxis - Xarelto Weight-Bearing as tolerated to right leg Plan for [facility] tomorrow pending approval and final arrangements  Avel Peacerew Adler Chartrand, PA-C Orthopaedic Surgery 07/16/2017, 9:17 AM

## 2017-07-16 NOTE — Progress Notes (Signed)
Physical Therapy Treatment Patient Details Name: Mario Edwards MRN: [id number] DOB: 04/28/1932 Today's Date: 07/16/2017    History of Present Illness Pt is an 83 year old male s/p R TKA with hx of L TKA 2014    PT Comments    Pt wished to participate in therapy however reports increased pain this afternoon.  Pt reports further pain medication received since dose this morning however notified RN of pt's pain, and she reported no recent pain meds given.  Pt attempted to perform a couple exercises however was not tolerating well so repositioned and provided ice to knee.     Follow Up Recommendations  SNF     Equipment Recommendations  None recommended by PT    Recommendations for Other Services       Precautions / Restrictions Precautions Precautions: Fall;Knee Required Braces or Orthoses: Knee Immobilizer - Right Restrictions Other Position/Activity Restrictions: WBAT    Mobility  Bed Mobility          Transfers            Ambulation/Gait     Stairs            Wheelchair Mobility    Modified Rankin (Stroke Patients Only)       Balance                                            Cognition Arousal/Alertness: Awake/alert Behavior During Therapy: WFL for tasks assessed/performed Overall Cognitive Status: Within Functional Limits for tasks assessed                                        Exercises Total Joint Exercises Ankle Circles/Pumps: AROM;Both;10 reps Quad Sets: AROM;Right;10 reps Heel Slides: AAROM;Right;5 reps Hip ABduction/ADduction: AAROM;Right;10 reps    General Comments        Pertinent Vitals/Pain Pain Assessment: 0-10 Pain Score: 10-Worst pain ever Pain Location: R knee  Pain Descriptors / Indicators: Aching;Sore;Tightness Pain Intervention(s): Repositioned;Limited activity within patient's tolerance;Ice applied;Other (comment) (RN notified)    Home Living                      Prior Function            PT Goals (current goals can now be found in the care plan section) Progress towards PT goals: Progressing toward goals    Frequency    7X/week      PT Plan Current plan remains appropriate    Co-evaluation              AM-PAC PT "6 Clicks" Daily Activity  Outcome Measure  Difficulty turning over in bed (including adjusting bedclothes, sheets and blankets)?: Unable Difficulty moving from lying on back to sitting on the side of the bed? : Unable Difficulty sitting down on and standing up from a chair with arms (e.g., wheelchair, bedside commode, etc,.)?: Unable Help needed moving to and from a bed to chair (including a wheelchair)?: Total Help needed walking in hospital room?: Total Help needed climbing 3-5 steps with a railing? : Total 6 Click Score: 6    End of Session Equipment Utilized During Treatment: Right knee immobilizer;Gait belt Activity Tolerance: Patient limited by pain Patient left: in bed;with call bell/phone within reach   PT Visit  Diagnosis: Other abnormalities of gait and mobility (R26.89);Pain Pain - Right/Left: Right Pain - part of body: Knee     Time: 1422-1430 PT Time Calculation (min) (ACUTE ONLY): 8 min  Charges:   $Therapeutic Exercise: 8-22 mins                    G Codes:       Zenovia JarredKati Lorenia Hoston, PT, DPT 07/16/2017 Pager: [phone]  Maida SaleLEMYRE,KATHrine E 07/16/2017, 3:03 PM

## 2017-07-17 LAB — CBC
HCT: 31.7 % — ABNORMAL LOW (ref 39.0–52.0)
Hemoglobin: 10.5 g/dL — ABNORMAL LOW (ref 13.0–17.0)
MCH: 31.5 pg (ref 26.0–34.0)
MCHC: 33.1 g/dL (ref 30.0–36.0)
MCV: 95.2 fL (ref 78.0–100.0)
Platelets: 181 10*3/uL (ref 150–400)
RBC: 3.33 MIL/uL — ABNORMAL LOW (ref 4.22–5.81)
RDW: 13.7 % (ref 11.5–15.5)
WBC: 7.7 10*3/uL (ref 4.0–10.5)

## 2017-07-17 MED ORDER — METHOCARBAMOL 500 MG PO TABS: 500.0000 mg | ORAL_TABLET | Freq: Four times a day (QID) | ORAL | 0 refills | Status: AC | PRN

## 2017-07-17 MED ORDER — TRAMADOL HCL 50 MG PO TABS: 50.0000 mg | ORAL_TABLET | Freq: Four times a day (QID) | ORAL | 0 refills | Status: AC | PRN

## 2017-07-17 NOTE — Progress Notes (Signed)
   Subjective: 3 Days Post-Op Procedure(s) (LRB): RIGHT TOTAL KNEE ARTHROPLASTY (Right) Patient reports pain as moderate last night. Patient seen in rounds for Dr. Lequita HaltAluisio.  Had a little bit of confusion last night, questionable sundowning, questionable medications.  Narcotics stopped.  Better this morning. Sitting up in chair. Patient is well, but has had some minor complaints of pain in the knee, requiring pain medications.  Denies N/V/SOB/CP. Patient is ready to go to the SNF of choice - [facility].  Objective: Vital signs in last 24 hours: Temp:  [97.5 F (36.4 C)-97.7 F (36.5 C)] 97.7 F (36.5 C) (11/01 0624) Pulse Rate:  [63-68] 68 (11/01 0624) Resp:  [12-16] 12 (11/01 0624) BP: (101-126)/(50-56) 101/50 (11/01 0624) SpO2:  [98 %-99 %] 98 % (11/01 0624)  Intake/Output from previous day:  Intake/Output Summary (Last 24 hours) at 07/17/17 0819 Last data filed at 07/17/17 0624  Gross per 24 hour  Intake              660 ml  Output              350 ml  Net              310 ml    Intake/Output this shift: No intake/output data recorded.  Labs:  Recent Labs  07/15/17 0534 07/16/17 0535 07/17/17 0519  HGB 11.3* 11.1* 10.5*    Recent Labs  07/16/17 0535 07/17/17 0519  WBC 9.2 7.7  RBC 3.49* 3.33*  HCT 33.1* 31.7*  PLT 183 181    Recent Labs  07/15/17 0534 07/16/17 0535  NA 138 137  K 4.3 4.1  CL 106 102  CO2 24 26  BUN 17 21*  CREATININE 1.20 1.09  GLUCOSE 126* 104*  CALCIUM 8.8* 8.9   No results for input(s): LABPT, INR in the last 72 hours.  EXAM: General - Patient is Alert, Appropriate and Oriented Extremity - Neurovascular intact Sensation intact distally Intact pulses distally Dorsiflexion/Plantar flexion intact Incision - clean, dry, no drainage Motor Function - intact, moving foot and toes well on exam.   Assessment/Plan: 3 Days Post-Op Procedure(s) (LRB): RIGHT TOTAL KNEE ARTHROPLASTY (Right) Procedure(s)  (LRB): RIGHT TOTAL KNEE ARTHROPLASTY (Right) Past Medical History:  Diagnosis Date  . Arthritis   . Coronary artery disease   . GERD (gastroesophageal reflux disease)   . Headache(784.0)    migraines  . Peripheral vascular disease (HCC)   . Pneumonia    years ago   Active Problems:   OA (osteoarthritis) of knee  Estimated body mass index is 24.41 kg/m as calculated from the following:   Height as of this encounter: 6\' 1"  (1.854 m).   Weight as of this encounter: 83.9 kg (185 lb). Up with therapy Discharge to SNF Diet - Cardiac diet Follow up - in 2 weeks Activity - WBAT Disposition - Skilled nursing facility Condition Upon Discharge - Stable D/C Meds - See DC Summary DVT Prophylaxis - Xarelto  Avel Peacerew Jaquita Bessire, PA-C Orthopaedic Surgery 07/17/2017, 8:19 AM

## 2017-07-17 NOTE — Progress Notes (Signed)
Physical Therapy Treatment Patient Details Name: Mario HeidelbergJohn D Doubrava MRN: [id number] DOB: Jul 07, 1932 Today's Date: 07/17/2017    History of Present Illness Pt is an 83 year old male s/p R TKA with hx of L TKA 2014    PT Comments    Pt motivated however progressing slowly due to pain.  Pt to d/c to SNF today.  Follow Up Recommendations  SNF     Equipment Recommendations  None recommended by PT    Recommendations for Other Services       Precautions / Restrictions Precautions Precautions: Fall;Knee Required Braces or Orthoses: Knee Immobilizer - Right Restrictions Other Position/Activity Restrictions: WBAT    Mobility  Bed Mobility Overal bed mobility: Needs Assistance Bed Mobility: Supine to Sit     Supine to sit: Min assist     General bed mobility comments: verbal cues for technique, assist for LE due to pain  Transfers Overall transfer level: Needs assistance Equipment used: Rolling walker (2 wheeled) Transfers: Sit to/from Stand Sit to Stand: Min assist         General transfer comment: assist to rise and steady, verbal cues for UE and LE positioning  Ambulation/Gait Ambulation/Gait assistance: Min assist Ambulation Distance (Feet): 32 Feet Assistive device: Rolling walker (2 wheeled) Gait Pattern/deviations: Step-to pattern;Decreased stance time - right;Antalgic     General Gait Details: verbal cues for sequence, RW positioning, step length, posture, pt continues with very effortful and painful ambulation, distance to tolerance   Stairs            Wheelchair Mobility    Modified Rankin (Stroke Patients Only)       Balance                                            Cognition Arousal/Alertness: Awake/alert Behavior During Therapy: WFL for tasks assessed/performed Overall Cognitive Status: Within Functional Limits for tasks assessed                                        Exercises      General  Comments        Pertinent Vitals/Pain Pain Assessment: Faces Faces Pain Scale: Hurts whole lot (pt states discomfort, appears distressed then at times says none) Pain Location: R knee  Pain Descriptors / Indicators: Aching;Sore;Tightness;Grimacing;Guarding Pain Intervention(s): Limited activity within patient's tolerance;Repositioned;Ice applied;Monitored during session    Home Living                      Prior Function            PT Goals (current goals can now be found in the care plan section) Progress towards PT goals: Progressing toward goals    Frequency    7X/week      PT Plan Current plan remains appropriate    Co-evaluation              AM-PAC PT "6 Clicks" Daily Activity  Outcome Measure  Difficulty turning over in bed (including adjusting bedclothes, sheets and blankets)?: Unable Difficulty moving from lying on back to sitting on the side of the bed? : Unable Difficulty sitting down on and standing up from a chair with arms (e.g., wheelchair, bedside commode, etc,.)?: Unable Help needed moving to and from a bed  to chair (including a wheelchair)?: A Lot Help needed walking in hospital room?: A Lot Help needed climbing 3-5 steps with a railing? : Total 6 Click Score: 8    End of Session Equipment Utilized During Treatment: Right knee immobilizer;Gait belt Activity Tolerance: Patient limited by pain Patient left: in chair;with call bell/phone within reach;with chair alarm set   PT Visit Diagnosis: Other abnormalities of gait and mobility (R26.89);Pain Pain - Right/Left: Right Pain - part of body: Knee     Time: 0919-0930 PT Time Calculation (min) (ACUTE ONLY): 11 min  Charges:  $Gait Training: 8-22 mins                    G Codes:       Zenovia Jarred, PT, DPT 07/17/2017 Pager: [phone]  Maida Sale E 07/17/2017, 12:24 PM

## 2020-05-10 DIAGNOSIS — I4891 Unspecified atrial fibrillation: Secondary | ICD-10-CM | POA: Diagnosis not present

## 2020-05-10 DIAGNOSIS — I361 Nonrheumatic tricuspid (valve) insufficiency: Secondary | ICD-10-CM

## 2020-05-10 DIAGNOSIS — I350 Nonrheumatic aortic (valve) stenosis: Secondary | ICD-10-CM

## 2021-09-16 DEATH — deceased
